# Patient Record
Sex: Female | Born: 1965
Health system: Southern US, Community
[De-identification: ages and names within clinical notes are randomized; demographics above are authoritative.]

## PROBLEM LIST (undated history)

## (undated) DIAGNOSIS — D219 Benign neoplasm of connective and other soft tissue, unspecified: Secondary | ICD-10-CM

## (undated) DIAGNOSIS — B019 Varicella without complication: Secondary | ICD-10-CM

## (undated) DIAGNOSIS — Z86718 Personal history of other venous thrombosis and embolism: Secondary | ICD-10-CM

## (undated) DIAGNOSIS — Z332 Encounter for elective termination of pregnancy: Secondary | ICD-10-CM

## (undated) DIAGNOSIS — Z803 Family history of malignant neoplasm of breast: Secondary | ICD-10-CM

## (undated) DIAGNOSIS — E785 Hyperlipidemia, unspecified: Secondary | ICD-10-CM

## (undated) DIAGNOSIS — H469 Unspecified optic neuritis: Secondary | ICD-10-CM

## (undated) HISTORY — PX: MYOMECTOMY: SHX85

## (undated) HISTORY — DX: Unspecified optic neuritis: H46.9

## (undated) HISTORY — DX: Family history of malignant neoplasm of breast: Z80.3

## (undated) HISTORY — DX: Varicella without complication: B01.9

## (undated) HISTORY — DX: Encounter for elective termination of pregnancy: Z33.2

## (undated) HISTORY — DX: Benign neoplasm of connective and other soft tissue, unspecified: D21.9

## (undated) HISTORY — DX: Hyperlipidemia, unspecified: E78.5

---

## 1998-04-26 HISTORY — PX: BUNIONECTOMY: SHX129

## 2005-02-10 ENCOUNTER — Ambulatory Visit: Payer: Self-pay | Admitting: Obstetrics and Gynecology

## 2006-03-31 ENCOUNTER — Ambulatory Visit: Payer: Self-pay | Admitting: Obstetrics and Gynecology

## 2007-06-27 ENCOUNTER — Ambulatory Visit: Payer: Self-pay | Admitting: Obstetrics and Gynecology

## 2008-06-27 ENCOUNTER — Ambulatory Visit: Payer: Self-pay | Admitting: Obstetrics and Gynecology

## 2009-07-02 ENCOUNTER — Ambulatory Visit: Payer: Self-pay | Admitting: Obstetrics and Gynecology

## 2010-06-09 LAB — HM PAP SMEAR

## 2010-07-07 ENCOUNTER — Ambulatory Visit: Payer: Self-pay | Admitting: Obstetrics and Gynecology

## 2010-07-07 LAB — HM MAMMOGRAPHY

## 2011-04-27 DIAGNOSIS — Z86718 Personal history of other venous thrombosis and embolism: Secondary | ICD-10-CM

## 2011-04-27 HISTORY — DX: Personal history of other venous thrombosis and embolism: Z86.718

## 2011-06-25 ENCOUNTER — Ambulatory Visit (INDEPENDENT_AMBULATORY_CARE_PROVIDER_SITE_OTHER): Payer: PRIVATE HEALTH INSURANCE | Admitting: Internal Medicine

## 2011-06-25 ENCOUNTER — Encounter: Payer: Self-pay | Admitting: Internal Medicine

## 2011-06-25 VITALS — BP 102/62 | HR 84 | Temp 98.3°F | Ht 63.0 in | Wt 125.0 lb

## 2011-06-25 DIAGNOSIS — M5430 Sciatica, unspecified side: Secondary | ICD-10-CM

## 2011-06-25 NOTE — Assessment & Plan Note (Signed)
Patient is not currently having any pain. Encouraged her to call or e-mail our office if she has a recurrent episode. Her description is most consistent with sciatic pain. We discussed potentially using a steroid taper to help with inflammation and pain should this recur. She will followup as needed.

## 2011-06-25 NOTE — Progress Notes (Signed)
Subjective:    Patient ID: Erin Robertson, female    DOB: 09/27/1965, 46 y.o.   MRN: [id number]  HPI 46 year old female presents to establish care. She reports that she is generally healthy. Her only concern today is a several year history of intermittent episodes of bilateral lower back and posterior thigh pain. She reports these episodes come and go. She was told by a previous physician to use Motrin on a regular basis during episodes of pain. She is unclear what triggers these episodes. She denies any trauma to her back or legs. She denies any weakness in her legs. She denies any numbness in her legs. She is not currently experiencing pain. She does not have any incontinence of bowel or bladder. Aside from this, she reports that she feels well. She follows a healthy diet and gets regular physical activity.  Outpatient Encounter Prescriptions as of 06/25/2011  Medication Sig Dispense Refill  . levonorgestrel-ethinyl estradiol (ENPRESSE,TRIVORA) tablet Take 1 tablet by mouth daily.        Review of Systems  Constitutional: Negative for fever, chills, appetite change, fatigue and unexpected weight change.  HENT: Negative for ear pain, congestion, sore throat, trouble swallowing, neck pain, voice change and sinus pressure.   Eyes: Negative for visual disturbance.  Respiratory: Negative for cough, shortness of breath, wheezing and stridor.   Cardiovascular: Negative for chest pain, palpitations and leg swelling.  Gastrointestinal: Negative for nausea, vomiting, abdominal pain, diarrhea, constipation, blood in stool, abdominal distention and anal bleeding.  Genitourinary: Negative for dysuria and flank pain.  Musculoskeletal: Negative for myalgias, arthralgias and gait problem.  Skin: Negative for color change and rash.  Neurological: Negative for dizziness and headaches.  Hematological: Negative for adenopathy. Does not bruise/bleed easily.  Psychiatric/Behavioral: Negative for suicidal ideas,  sleep disturbance and dysphoric mood. The patient is not nervous/anxious.    BP 102/62  Pulse 84  Temp(Src) 98.3 F (36.8 C) (Oral)  Ht 5\' 3"  (1.6 m)  Wt 125 lb (56.7 kg)  BMI 22.14 kg/m2  SpO2 100%  LMP 06/14/2011     Objective:   Physical Exam  Constitutional: She is oriented to person, place, and time. She appears well-developed and well-nourished. No distress.  HENT:  Head: Normocephalic and atraumatic.  Right Ear: External ear normal.  Left Ear: External ear normal.  Nose: Nose normal.  Mouth/Throat: Oropharynx is clear and moist. No oropharyngeal exudate.  Eyes: Conjunctivae are normal. Pupils are equal, round, and reactive to light. Right eye exhibits no discharge. Left eye exhibits no discharge. No scleral icterus.  Neck: Normal range of motion. Neck supple. No tracheal deviation present. No thyromegaly present.  Cardiovascular: Normal rate, regular rhythm, normal heart sounds and intact distal pulses.  Exam reveals no gallop and no friction rub.   No murmur heard. Pulmonary/Chest: Effort normal and breath sounds normal. No respiratory distress. She has no wheezes. She has no rales. She exhibits no tenderness.  Abdominal: Soft. Bowel sounds are normal. She exhibits no distension and no mass. There is no tenderness. There is no rebound and no guarding.  Musculoskeletal: Normal range of motion. She exhibits no edema and no tenderness.  Lymphadenopathy:    She has no cervical adenopathy.  Neurological: She is alert and oriented to person, place, and time. No cranial nerve deficit. She exhibits normal muscle tone. Coordination normal.  Skin: Skin is warm and dry. No rash noted. She is not diaphoretic. No erythema. No pallor.  Psychiatric: She has a normal mood and affect. Her  behavior is normal. Judgment and thought content normal.          Assessment & Plan:

## 2011-06-25 NOTE — Patient Instructions (Signed)
Vit D 1000-2000 units daily

## 2011-08-05 ENCOUNTER — Ambulatory Visit: Payer: Self-pay | Admitting: Family Medicine

## 2011-11-25 DIAGNOSIS — H469 Unspecified optic neuritis: Secondary | ICD-10-CM

## 2011-11-25 HISTORY — DX: Unspecified optic neuritis: H46.9

## 2011-12-03 ENCOUNTER — Ambulatory Visit: Payer: Self-pay | Admitting: Ophthalmology

## 2011-12-18 ENCOUNTER — Ambulatory Visit: Payer: Self-pay | Admitting: Neurology

## 2012-01-04 ENCOUNTER — Ambulatory Visit: Payer: Self-pay | Admitting: Neurology

## 2012-01-04 LAB — CSF CELL COUNT WITH DIFFERENTIAL
Neutrophils: 25 %
Other Cells: 0 %
RBC (CSF): 5 /mm3
WBC (CSF): 2 /mm3

## 2012-01-04 LAB — APTT: Activated PTT: 26.2 secs (ref 23.6–35.9)

## 2012-01-04 LAB — PROTIME-INR
INR: 0.9
Prothrombin Time: 12.1 secs (ref 11.5–14.7)

## 2012-03-24 ENCOUNTER — Encounter: Payer: Self-pay | Admitting: Internal Medicine

## 2012-03-24 ENCOUNTER — Ambulatory Visit (INDEPENDENT_AMBULATORY_CARE_PROVIDER_SITE_OTHER): Payer: PRIVATE HEALTH INSURANCE | Admitting: Internal Medicine

## 2012-03-24 VITALS — BP 118/68 | HR 78 | Temp 98.3°F | Resp 16 | Wt 126.0 lb

## 2012-03-24 DIAGNOSIS — H469 Unspecified optic neuritis: Secondary | ICD-10-CM | POA: Insufficient documentation

## 2012-03-24 DIAGNOSIS — R1031 Right lower quadrant pain: Secondary | ICD-10-CM | POA: Insufficient documentation

## 2012-03-24 DIAGNOSIS — R002 Palpitations: Secondary | ICD-10-CM | POA: Insufficient documentation

## 2012-03-24 NOTE — Assessment & Plan Note (Signed)
Chronic over several months. Location at [place] raises question of chronic appendicitis, however no other symptoms such as fever, chills, diarrhea, constipation.  Alternative etiologies include nephrolithiasis, or ovarian pathology, however pt reports pelvic US was normal. Will get CT abd and pelvis for further evaluation.

## 2012-03-24 NOTE — Assessment & Plan Note (Signed)
Recent episodes of palpitations, occuring throughout the day and night. No other symptoms such as diaphoresis, nausea, chest pain. EKG normal. Given constellation of other symptoms, question if Holter monitor and ECHO might be helpful in evaluation.

## 2012-03-24 NOTE — Assessment & Plan Note (Addendum)
Unclear etiology. Evaluation to date has shown no evidence of MS, NMO or other underlying cause of neuritis. Only lab abnormality has been elevated protein levels in CSF without oligoclonal bands.  We reviewed data on risk of MS after optic neuritis (30% at 5 years). Pt has been evaluated both locally and at [facility]. We discussed referral to tertiary care center such as [facility] for another opinion. Pt will discuss this with her family.

## 2012-03-24 NOTE — Progress Notes (Signed)
Subjective:    Patient ID: Erin Robertson, female    DOB: 1966/02/05, 46 y.o.   MRN: [id number]  HPI 46YO female presents for follow up. She reports that in August 2013, she developed blurred vision in her right eye. She ultimately went to [facility] for evaluation and was found to have optic neuritis. She was referred to Neurology and had evaluation including lab work, MRI of brain and cervical spine which she reports were normal. She also had lumbar puncture which showed elevated protein but no oligoclonal bands. She then went for second opinion at [facility]. Additional evaluation including lab testing for NMO was normal.  She was told that she may eventually develop MS. No specific interventions were recommended. She is interested to know if any further testing would be helpful.  She is also concerned today about several weeks of intermittent palpitations. These are described as rapid forceful beats, which occur both during the day and night, typically at rest. She denies chest pain, dyspnea, diaphoresis. The episodes last a few seconds then resolve without intervention.  She is also concerned about several months history of right lower abdominal pain.  Pain typically occurs at night. Described as sharp pain located between right hip and umbilicus that wakes pt from sleep. No fever, chills, dysuria, hematuria, flank pain, change in bowel habits, blood in stool. Was seen by OB in the past with these symptoms and reports that pelvic US was normal.  Outpatient Encounter Prescriptions as of 03/24/2012  Medication Sig Dispense Refill  . levonorgestrel-ethinyl estradiol (ENPRESSE,TRIVORA) tablet Take 1 tablet by mouth daily.       BP 118/68  Pulse 78  Temp 98.3 F (36.8 C) (Oral)  Resp 16  Wt 126 lb (57.153 kg)  LMP 03/17/2012  Review of Systems  Constitutional: Negative for fever, chills, appetite change, fatigue and unexpected weight change.  HENT: Negative for ear pain,  congestion, sore throat, trouble swallowing, neck pain, voice change and sinus pressure.   Eyes: Positive for visual disturbance.  Respiratory: Negative for cough, shortness of breath, wheezing and stridor.   Cardiovascular: Positive for palpitations. Negative for chest pain and leg swelling.  Gastrointestinal: Negative for nausea, vomiting, abdominal pain, diarrhea, constipation, blood in stool, abdominal distention and anal bleeding.  Genitourinary: Negative for dysuria and flank pain.  Musculoskeletal: Negative for myalgias, arthralgias and gait problem.  Skin: Negative for color change and rash.  Neurological: Negative for dizziness and headaches.  Hematological: Negative for adenopathy. Does not bruise/bleed easily.  Psychiatric/Behavioral: Negative for suicidal ideas, sleep disturbance and dysphoric mood. The patient is not nervous/anxious.        Objective:   Physical Exam  Constitutional: She is oriented to person, place, and time. She appears well-developed and well-nourished. No distress.  HENT:  Head: Normocephalic and atraumatic.  Right Ear: External ear normal.  Left Ear: External ear normal.  Nose: Nose normal.  Mouth/Throat: Oropharynx is clear and moist. No oropharyngeal exudate.  Eyes: Conjunctivae normal are normal. Pupils are equal, round, and reactive to light. Right eye exhibits no discharge. Left eye exhibits no discharge. No scleral icterus.  Neck: Normal range of motion. Neck supple. No tracheal deviation present. No thyromegaly present.  Cardiovascular: Normal rate, regular rhythm, normal heart sounds and intact distal pulses.  Exam reveals no gallop and no friction rub.   No murmur heard. Pulmonary/Chest: Effort normal and breath sounds normal. No respiratory distress. She has no wheezes. She has no rales. She exhibits no tenderness.  Abdominal: Soft. Bowel sounds are normal. She exhibits no distension. There is tenderness (RLQ). There is tenderness at [place]  [place]. There is no rebound and no guarding.  Musculoskeletal: Normal range of motion. She exhibits no edema and no tenderness.  Lymphadenopathy:    She has no cervical adenopathy.  Neurological: She is alert and oriented to person, place, and time. No cranial nerve deficit. She exhibits normal muscle tone. Coordination normal.  Skin: Skin is warm and dry. No rash noted. She is not diaphoretic. No erythema. No pallor.  Psychiatric: She has a normal mood and affect. Her behavior is normal. Judgment and thought content normal.          Assessment & Plan:

## 2012-03-30 ENCOUNTER — Ambulatory Visit (HOSPITAL_COMMUNITY)
Admission: RE | Admit: 2012-03-30 | Discharge: 2012-03-30 | Disposition: A | Discharge status: Home / Self Care | Payer: PRIVATE HEALTH INSURANCE | Source: Ambulatory Visit | Attending: Internal Medicine | Admitting: Internal Medicine

## 2012-03-30 DIAGNOSIS — D259 Leiomyoma of uterus, unspecified: Secondary | ICD-10-CM | POA: Insufficient documentation

## 2012-03-30 DIAGNOSIS — R1031 Right lower quadrant pain: Secondary | ICD-10-CM

## 2012-03-30 MED ORDER — IOHEXOL 300 MG/ML  SOLN
80.0000 mL | Freq: Once | INTRAMUSCULAR | Status: AC | PRN
  Administered 2012-03-30: 80 mL via INTRAVENOUS

## 2012-03-31 ENCOUNTER — Encounter: Payer: Self-pay | Admitting: Cardiovascular Disease

## 2012-03-31 ENCOUNTER — Ambulatory Visit (INDEPENDENT_AMBULATORY_CARE_PROVIDER_SITE_OTHER): Payer: PRIVATE HEALTH INSURANCE | Admitting: Cardiovascular Disease

## 2012-03-31 ENCOUNTER — Telehealth: Payer: Self-pay | Admitting: Internal Medicine

## 2012-03-31 VITALS — BP 120/72 | HR 89 | Ht 62.0 in | Wt 126.5 lb

## 2012-03-31 DIAGNOSIS — R935 Abnormal findings on diagnostic imaging of other abdominal regions, including retroperitoneum: Secondary | ICD-10-CM

## 2012-03-31 DIAGNOSIS — E785 Hyperlipidemia, unspecified: Secondary | ICD-10-CM

## 2012-03-31 DIAGNOSIS — E7800 Pure hypercholesterolemia, unspecified: Secondary | ICD-10-CM | POA: Insufficient documentation

## 2012-03-31 DIAGNOSIS — R002 Palpitations: Secondary | ICD-10-CM

## 2012-03-31 NOTE — Telephone Encounter (Signed)
Please call patient on her cell with this appointment.[phone].

## 2012-03-31 NOTE — Telephone Encounter (Signed)
The patient is scheduled for in hospital and they should have been scheduled for outpatient on there order for an ABD ultrasound. Please change order.

## 2012-03-31 NOTE — Assessment & Plan Note (Signed)
Cholesterol of 200. Mother and grandmother also with high cholesterol. No significant coronary artery disease or stroke history. She could try red yeast rice if she would like for more aggressive numbers.

## 2012-03-31 NOTE — Patient Instructions (Addendum)
Please take bystolic 1/2 pill as needed for fast, strong or irregular heart heat At night or in the Am Ok to take a second 1/2 pill for symptoms  Consider Red Yeast Rice 2 to 4 pills a day  Please call us if you have new issues that need to be addressed before your next appt.

## 2012-03-31 NOTE — Progress Notes (Signed)
   Patient ID: Erin Robertson, female    DOB: Mar 31, 1966, 46 y.o.   MRN: [id number]  HPI Comments: Varicose 46 year old woman, patient of Dr. Dan Humphreys, no prior cardiac history who presents for evaluation for symptoms of strong heart beat at nighttime prominently, sometimes with pulsation radiating into her head from her chest.   She reports that she has had significant stress recently. Diagnosis of optic neuritis in August 2013. Significant workup since that time including subspecialist evaluation at [facility]. She has neurology at [facility] and ophthalmology in [place]. Also right lower quadrant pain, CT scan done showing nonspecific hepatic findings. Hepatic ultrasound ordered. Interestingly, CT scan did not show pericardial effusion or pleural effusion.  She reports that she feels well, takes care of 4 children some of which are grown. She has 45 year old granddaughter. She works a full-time job in Audiological [employer]. She's had a difficult time with her right eye deficits. She describes this as a blurry vision on the right, chronically with minimal improvement since the initial diagnosis. She is learning to adapt and overcompensate with her left eye. Still able to drive. She denies any significant large to medium.   EKG shows normal sinus rhythm with rate 89 beats per minute, no significant ST or T wave changes.    Outpatient Encounter Prescriptions as of 03/31/2012  Medication Sig Dispense Refill  . levonorgestrel-ethinyl estradiol (ENPRESSE,TRIVORA) tablet Take 1 tablet by mouth daily.        Review of Systems  Constitutional: Negative.   HENT: Negative.   Eyes: Negative.   Respiratory: Negative.   Cardiovascular: Positive for palpitations.  Gastrointestinal: Negative.   Musculoskeletal: Negative.   Skin: Negative.   Neurological: Negative.   Hematological: Negative.   Psychiatric/Behavioral: Negative.   All other systems reviewed and are negative.    BP 120/72  Pulse 89  Ht 5\' 2"  (1.575 m)  Wt  126 lb 8 oz (57.38 kg)  BMI 23.14 kg/m2  LMP 03/17/2012  Physical Exam  Nursing note and vitals reviewed. Constitutional: She is oriented to person, place, and time. She appears well-developed and well-nourished.  HENT:  Head: Normocephalic.  Nose: Nose normal.  Mouth/Throat: Oropharynx is clear and moist.  Eyes: Conjunctivae normal are normal. Pupils are equal, round, and reactive to light.  Neck: Normal range of motion. Neck supple. No JVD present.  Cardiovascular: Normal rate, regular rhythm, S1 normal, S2 normal, normal heart sounds and intact distal pulses.  Exam reveals no gallop and no friction rub.   No murmur heard. Pulmonary/Chest: Effort normal and breath sounds normal. No respiratory distress. She has no wheezes. She has no rales. She exhibits no tenderness.  Abdominal: Soft. Bowel sounds are normal. She exhibits no distension. There is no tenderness.  Musculoskeletal: Normal range of motion. She exhibits no edema and no tenderness.  Lymphadenopathy:    She has no cervical adenopathy.  Neurological: She is alert and oriented to person, place, and time. Coordination normal.  Skin: Skin is warm and dry. No rash noted. No erythema.  Psychiatric: She has a normal mood and affect. Her behavior is normal. Judgment and thought content normal.         Assessment and Plan

## 2012-03-31 NOTE — Assessment & Plan Note (Signed)
She reports strong heart beat, predominantly at nighttime. By her report, rhythm is regular with no significant ectopy. We have discussed the various treatment options with her including echocardiogram, Holter. Unless concerned about pericardial effusion given her recent CT scan showing no effusion. EKG is normal, otherwise she is feeling well with activity and exertion. We'll hold off on echocardiogram for now. We have shown her several programs for her smart phone and how to check her pulse at home. I suspect her stress over the past several months is playing a role in her strong heart beat at nighttime. Heart rate is mildly elevated on today's visit though adequate. We have suggested she could try low-dose beta blocker as needed for symptom relief. We have given her samples of bystolic suggested she try 5 mg when necessary for strong heart beat. She could take extra 5 mg as needed. His symptoms get worse, she will contact our office. Certainly, Holter could be performed but by her history, it sounds regular with minimal ectopy. Treatment for any APCs or PVCs would be beta blocker initially. She is happy with this plan and will contact our office if symptoms get any worse.

## 2012-03-31 NOTE — Telephone Encounter (Signed)
Order entered.  Please schedule

## 2012-04-04 ENCOUNTER — Ambulatory Visit: Payer: Self-pay | Admitting: Internal Medicine

## 2012-04-04 ENCOUNTER — Telehealth: Payer: Self-pay | Admitting: Internal Medicine

## 2012-04-04 NOTE — Telephone Encounter (Signed)
UR liver was normal

## 2012-04-18 ENCOUNTER — Encounter: Payer: Self-pay | Admitting: Internal Medicine

## 2012-06-10 ENCOUNTER — Other Ambulatory Visit: Payer: Self-pay

## 2012-08-07 ENCOUNTER — Encounter: Payer: Self-pay | Admitting: Internal Medicine

## 2012-08-07 ENCOUNTER — Ambulatory Visit (INDEPENDENT_AMBULATORY_CARE_PROVIDER_SITE_OTHER): Payer: BC Managed Care – PPO | Admitting: Internal Medicine

## 2012-08-07 VITALS — BP 108/72 | HR 81 | Temp 99.0°F | Wt 124.0 lb

## 2012-08-07 DIAGNOSIS — N912 Amenorrhea, unspecified: Secondary | ICD-10-CM

## 2012-08-07 DIAGNOSIS — R1031 Right lower quadrant pain: Secondary | ICD-10-CM

## 2012-08-07 DIAGNOSIS — Z1239 Encounter for other screening for malignant neoplasm of breast: Secondary | ICD-10-CM

## 2012-08-07 DIAGNOSIS — G8929 Other chronic pain: Secondary | ICD-10-CM

## 2012-08-07 DIAGNOSIS — H469 Unspecified optic neuritis: Secondary | ICD-10-CM

## 2012-08-07 LAB — COMPREHENSIVE METABOLIC PANEL
CO2: 25 mEq/L (ref 19–32)
GFR: 96.4 mL/min (ref >60.00)
Glucose, Bld: 110 mg/dL — ABNORMAL HIGH (ref 70–99)
Sodium: 137 mEq/L (ref 135–145)
Total Bilirubin: 0.5 mg/dL (ref 0.3–1.2)
Total Protein: 6.8 g/dL (ref 6.0–8.3)

## 2012-08-07 NOTE — Assessment & Plan Note (Signed)
Chronic intermittent RLQ abdominal pain. Reviewed previous CT abdomen which showed no abnormalities in RLQ. Given that symptoms are persistent and pt has amenorrhea, will get pelvic US for further evaluation. Question if ovarian torsion could be contributing. Follow up 4 weeks and prn.

## 2012-08-07 NOTE — Assessment & Plan Note (Signed)
Two months amenorrhea after stopping OCP. Will check LH and FSH today to see if consistent with menopause. Will also check serum HCG. Will continue barrier protect with intercourse. Will get pelvic US for further evaluation amenorrhea and RLQ pain.

## 2012-08-07 NOTE — Progress Notes (Signed)
Subjective:    Patient ID: Erin Robertson, female    DOB: 1965/05/14, 47 y.o.   MRN: [id number]  HPI 47 year old female with history of optic neuritis and chronic right-sided abdominal pain presents for followup. In regards to optic neuritis, she notes she was recently seen by ophthalmologist at [facility]. He questions the diagnosis of optic neuritis and suggested she may actually have optic neuropathy. Regardless, plan is to continue to monitor for now.  She is concerned today about 2 month history of amenorrhea. She stopped taking oral contraceptives in February 2014. Since that time, she has not had any further menstrual cycle. She reports that she stopped taking oral contraceptives because of concern about risk of blood clots given the potential history of optic neuropathy from arteritis. She is interested in other methods of contraception.  She also reports continued intermittent right lower abdominal pain. This is described as aching which has been present off and on for years. She denies any change in bowel habits, blood in her stool, dysuria, hematuria, fever, chills. This is been evaluated extensively in the past with pelvic ultrasound, CT of the abdomen, all of which were normal.  Outpatient Encounter Prescriptions as of 08/07/2012  Medication Sig Dispense Refill  . aspirin 81 MG tablet Take 81 mg by mouth daily.      . Vitamin D, Ergocalciferol, (DRISDOL) 50000 UNITS CAPS Take 50,000 Units by mouth once a week.      . [DISCONTINUED] levonorgestrel-ethinyl estradiol (ENPRESSE,TRIVORA) tablet Take 1 tablet by mouth daily.       No facility-administered encounter medications on file as of 08/07/2012.   BP 108/72  Pulse 81  Temp(Src) 99 F (37.2 C) (Oral)  Wt 124 lb (56.246 kg)  BMI 22.67 kg/m2  SpO2 97%  LMP 06/13/2012  Review of Systems  Constitutional: Negative for fever, chills, appetite change, fatigue and unexpected weight change.  HENT: Negative for ear pain,  congestion, sore throat, trouble swallowing, neck pain, voice change and sinus pressure.   Eyes: Negative for visual disturbance.  Respiratory: Negative for cough, shortness of breath, wheezing and stridor.   Cardiovascular: Negative for chest pain, palpitations and leg swelling.  Gastrointestinal: Positive for abdominal pain. Negative for nausea, vomiting, diarrhea, constipation, blood in stool, abdominal distention and anal bleeding.  Genitourinary: Positive for menstrual problem and pelvic pain. Negative for dysuria and flank pain.  Musculoskeletal: Negative for myalgias, arthralgias and gait problem.  Skin: Negative for color change and rash.  Neurological: Negative for dizziness and headaches.  Hematological: Negative for adenopathy. Does not bruise/bleed easily.  Psychiatric/Behavioral: Negative for suicidal ideas, sleep disturbance and dysphoric mood. The patient is not nervous/anxious.        Objective:   Physical Exam  Constitutional: She is oriented to person, place, and time. She appears well-developed and well-nourished. No distress.  HENT:  Head: Normocephalic and atraumatic.  Right Ear: External ear normal.  Left Ear: External ear normal.  Nose: Nose normal.  Mouth/Throat: Oropharynx is clear and moist. No oropharyngeal exudate.  Eyes: Conjunctivae are normal. Pupils are equal, round, and reactive to light. Right eye exhibits no discharge. Left eye exhibits no discharge. No scleral icterus.  Neck: Normal range of motion. Neck supple. No tracheal deviation present. No thyromegaly present.  Cardiovascular: Normal rate, regular rhythm, normal heart sounds and intact distal pulses.  Exam reveals no gallop and no friction rub.   No murmur heard. Pulmonary/Chest: Effort normal and breath sounds normal. No respiratory distress. She has no wheezes. She  has no rales. She exhibits no tenderness.  Abdominal: Soft. Bowel sounds are normal. She exhibits no distension and no mass. There  is tenderness (RLQ). There is no rebound and no guarding.  Musculoskeletal: Normal range of motion. She exhibits no edema and no tenderness.  Lymphadenopathy:    She has no cervical adenopathy.  Neurological: She is alert and oriented to person, place, and time. No cranial nerve deficit. She exhibits normal muscle tone. Coordination normal.  Skin: Skin is warm and dry. No rash noted. She is not diaphoretic. No erythema. No pallor.  Psychiatric: She has a normal mood and affect. Her behavior is normal. Judgment and thought content normal.          Assessment & Plan:

## 2012-08-07 NOTE — Assessment & Plan Note (Signed)
Reviewed recent notes from ophthalmologist at [facility]. Will continue current plan of observation with neurology follow up. Unclear whether symptoms secondary to optic neuritis or optic neuropathy.

## 2012-08-09 ENCOUNTER — Ambulatory Visit: Payer: Self-pay | Admitting: Internal Medicine

## 2012-08-10 ENCOUNTER — Encounter: Payer: Self-pay | Admitting: Internal Medicine

## 2012-08-10 ENCOUNTER — Telehealth: Payer: Self-pay | Admitting: Internal Medicine

## 2012-08-10 NOTE — Telephone Encounter (Signed)
Ultrasound of the pelvis shows multiple fibroids in the uterus.  There is also a small cyst in the right ovary.

## 2012-08-22 ENCOUNTER — Ambulatory Visit: Payer: Self-pay | Admitting: Internal Medicine

## 2012-09-11 ENCOUNTER — Encounter: Payer: Self-pay | Admitting: Internal Medicine

## 2012-11-21 LAB — HM PAP SMEAR

## 2013-03-01 ENCOUNTER — Other Ambulatory Visit: Payer: Self-pay

## 2014-01-22 ENCOUNTER — Encounter: Payer: Self-pay | Admitting: Internal Medicine

## 2014-01-22 ENCOUNTER — Ambulatory Visit (INDEPENDENT_AMBULATORY_CARE_PROVIDER_SITE_OTHER): Payer: BC Managed Care – PPO | Admitting: Internal Medicine

## 2014-01-22 VITALS — BP 102/78 | HR 67 | Temp 98.3°F | Ht 62.75 in | Wt 123.5 lb

## 2014-01-22 DIAGNOSIS — Z0000 Encounter for general adult medical examination without abnormal findings: Secondary | ICD-10-CM

## 2014-01-22 DIAGNOSIS — Z1231 Encounter for screening mammogram for malignant neoplasm of breast: Secondary | ICD-10-CM | POA: Insufficient documentation

## 2014-01-22 NOTE — Progress Notes (Signed)
   Subjective:    Patient ID: Erin Robertson, female    DOB: February 23, 1966, 48 y.o.   MRN: [id number]  HPI 48YO female presents for annual exam.  Seen by Dr. Amalia Hailey last July. No changes made to medications. PAP was normal per her report.   Feeling well. Exercises by walking on occasion. Trying to follow a healthy diet.  Review of Systems  Constitutional: Negative for fever, chills, appetite change, fatigue and unexpected weight change.  Eyes: Negative for visual disturbance.  Respiratory: Negative for shortness of breath.   Cardiovascular: Negative for chest pain and leg swelling.  Gastrointestinal: Negative for vomiting, abdominal pain, diarrhea and constipation.  Genitourinary: Negative for vaginal bleeding and vaginal discharge.  Musculoskeletal: Negative for arthralgias and myalgias.  Skin: Negative for color change and rash.  Hematological: Negative for adenopathy. Does not bruise/bleed easily.  Psychiatric/Behavioral: Negative for dysphoric mood. The patient is not nervous/anxious.        Objective:    BP 102/78  Pulse 67  Temp(Src) 98.3 F (36.8 C) (Oral)  Ht 5' 2.75" (1.594 m)  Wt 123 lb 8 oz (56.019 kg)  BMI 22.05 kg/m2  SpO2 98%  LMP 12/29/2013 Physical Exam  Constitutional: She is oriented to person, place, and time. She appears well-developed and well-nourished. No distress.  HENT:  Head: Normocephalic and atraumatic.  Right Ear: External ear normal.  Left Ear: External ear normal.  Nose: Nose normal.  Mouth/Throat: Oropharynx is clear and moist. No oropharyngeal exudate.  Eyes: Conjunctivae and EOM are normal. Pupils are equal, round, and reactive to light. Right eye exhibits no discharge.  Neck: Normal range of motion. Neck supple. No thyromegaly present.  Cardiovascular: Normal rate, regular rhythm, normal heart sounds and intact distal pulses.  Exam reveals no gallop and no friction rub.   No murmur heard. Pulmonary/Chest: Effort normal. No respiratory  distress. She has no wheezes. She has no rales.  Abdominal: Soft. Bowel sounds are normal. She exhibits no distension and no mass. There is no tenderness. There is no rebound and no guarding.  Musculoskeletal: Normal range of motion. She exhibits no edema and no tenderness.  Lymphadenopathy:    She has no cervical adenopathy.  Neurological: She is alert and oriented to person, place, and time. No cranial nerve deficit. Coordination normal.  Skin: Skin is warm and dry. No rash noted. She is not diaphoretic. No erythema. No pallor.  Psychiatric: She has a normal mood and affect. Her behavior is normal. Judgment and thought content normal.          Assessment & Plan:   Problem List Items Addressed This Visit     Unprioritized   Routine general medical examination at a health care facility - Primary     General medical exam normal today. Breast and pelvic exam deferred as recently completed by her OB. Will request records on PAP smear. Mammogram ordered. Referral placed for colonoscopy. Reviewed recent labs with pt which showed normal kidney and liver function, normal blood counts. Flu vaccine declined.    Relevant Orders      MM Digital Screening      Ambulatory referral to Gastroenterology       Return in about 1 year (around 01/23/2015) for Physical.

## 2014-01-22 NOTE — Progress Notes (Signed)
Pre visit review using our clinic review tool, if applicable. No additional management support is needed unless otherwise documented below in the visit note. 

## 2014-01-22 NOTE — Assessment & Plan Note (Signed)
General medical exam normal today. Breast and pelvic exam deferred as recently completed by her OB. Will request records on PAP smear. Mammogram ordered. Referral placed for colonoscopy. Reviewed recent labs with pt which showed normal kidney and liver function, normal blood counts. Flu vaccine declined.

## 2014-01-22 NOTE — Patient Instructions (Signed)

## 2014-03-05 ENCOUNTER — Ambulatory Visit: Payer: Self-pay | Admitting: Internal Medicine

## 2014-03-05 ENCOUNTER — Encounter: Payer: Self-pay | Admitting: *Deleted

## 2014-03-05 LAB — HM MAMMOGRAPHY: HM MAMMO: NEGATIVE

## 2014-05-02 ENCOUNTER — Encounter: Payer: Self-pay | Admitting: Internal Medicine

## 2014-05-10 ENCOUNTER — Ambulatory Visit: Payer: Self-pay | Admitting: Unknown Physician Specialty

## 2014-05-10 LAB — HM COLONOSCOPY

## 2014-05-22 ENCOUNTER — Encounter: Payer: Self-pay | Admitting: *Deleted

## 2014-06-11 ENCOUNTER — Ambulatory Visit (INDEPENDENT_AMBULATORY_CARE_PROVIDER_SITE_OTHER): Payer: BLUE CROSS/BLUE SHIELD | Admitting: Internal Medicine

## 2014-06-11 ENCOUNTER — Encounter: Payer: Self-pay | Admitting: Internal Medicine

## 2014-06-11 VITALS — BP 92/64 | HR 66 | Temp 98.2°F | Ht 62.75 in | Wt 120.2 lb

## 2014-06-11 DIAGNOSIS — R197 Diarrhea, unspecified: Secondary | ICD-10-CM | POA: Insufficient documentation

## 2014-06-11 DIAGNOSIS — A09 Infectious gastroenteritis and colitis, unspecified: Secondary | ICD-10-CM

## 2014-06-11 MED ORDER — CIPROFLOXACIN HCL 500 MG PO TABS: 500.0000 mg | ORAL_TABLET | Freq: Two times a day (BID) | ORAL | Status: DC

## 2014-06-11 NOTE — Progress Notes (Signed)
Subjective:    Patient ID: Erin Robertson, female    DOB: [birth year], 49 y.o.   MRN: [id number]  HPI  49YO female presents for acute visit.  Diarrhea - Woke with abdominal cramping and diarrhea with blood and mucous on Sunday. Estimated as tbsp of blood. No NV. Rest of day, felt fine, but had a second episode of bloody diarrhea in the afternoon and third that night. Felt fine Monday, but developed bloody mucous in the stool in the evening. Appetite was normal yesterday. No fever. Feels fine today. Stayed out of work because of diarrhea. No diarrhea all day today. Tolerating regular food.  Traveled in December to [place]. Had mild diarrhea then, which resolved.  No known sick contacts. Went to [place] with [place] Saturday.  Colonoscopy in 04/2014 was normal.  Past medical, surgical, family and social history per today's encounter.  Review of Systems  Constitutional: Negative for fever, chills, appetite change, fatigue and unexpected weight change.  Eyes: Negative for visual disturbance.  Respiratory: Negative for shortness of breath.   Cardiovascular: Negative for chest pain and leg swelling.  Gastrointestinal: Positive for abdominal pain, diarrhea and blood in stool. Negative for nausea, vomiting, abdominal distention and rectal pain.  Skin: Negative for color change and rash.  Hematological: Negative for adenopathy. Does not bruise/bleed easily.  Psychiatric/Behavioral: Negative for dysphoric mood. The patient is not nervous/anxious.        Objective:    BP 92/64 mmHg  Pulse 66  Temp(Src) 98.2 F (36.8 C) (Oral)  Ht 5' 2.75" (1.594 m)  Wt 120 lb 4 oz (54.545 kg)  BMI 21.47 kg/m2  SpO2 100%  LMP 05/16/2014 Physical Exam  Constitutional: She is oriented to person, place, and time. She appears well-developed and well-nourished. No distress.  HENT:  Head: Normocephalic and atraumatic.  Right Ear: External ear normal.  Left Ear: External ear normal.  Nose: Nose  normal.  Mouth/Throat: Oropharynx is clear and moist.  Eyes: Conjunctivae are normal. Pupils are equal, round, and reactive to light. Right eye exhibits no discharge. Left eye exhibits no discharge. No scleral icterus.  Neck: Normal range of motion. Neck supple. No tracheal deviation present. No thyromegaly present.  Cardiovascular: Normal rate, regular rhythm, normal heart sounds and intact distal pulses.  Exam reveals no gallop and no friction rub.   No murmur heard. Pulmonary/Chest: Effort normal and breath sounds normal. No respiratory distress. She has no wheezes. She has no rales. She exhibits no tenderness.  Abdominal: Soft. Bowel sounds are normal. She exhibits no distension and no mass. There is no tenderness. There is no rebound and no guarding.  Musculoskeletal: Normal range of motion. She exhibits no edema or tenderness.  Lymphadenopathy:    She has no cervical adenopathy.  Neurological: She is alert and oriented to person, place, and time. No cranial nerve deficit. She exhibits normal muscle tone. Coordination normal.  Skin: Skin is warm and dry. No rash noted. She is not diaphoretic. No erythema. No pallor.  Psychiatric: She has a normal mood and affect. Her behavior is normal. Judgment and thought content normal.          Assessment & Plan:   Problem List Items Addressed This Visit      Unprioritized   Bloody diarrhea - Primary    3 days of bloody diarrhea. Suspect infectious source. We discussed that this will likely be self-limiting. However if she has persistent symptoms, will get stool culture, testing for E.coli and CDiff.  If symptoms persistent will start Cipro bid. Encouraged adequate fluids for hydration. Will check CBC and CMP with labs. Follow up 1 week and prn.      Relevant Medications   ciprofloxacin (CIPRO) tablet   Other Relevant Orders   Stool Culture   Stool C-Diff Toxin Assay   CBC w/Diff   Comprehensive metabolic panel       Return in about 1  week (around 06/18/2014) for Recheck.

## 2014-06-11 NOTE — Assessment & Plan Note (Signed)
3 days of bloody diarrhea. Suspect infectious source. We discussed that this will likely be self-limiting. However if she has persistent symptoms, will get stool culture, testing for E.coli and CDiff. If symptoms persistent will start Cipro bid. Encouraged adequate fluids for hydration. Will check CBC and CMP with labs. Follow up 1 week and prn.

## 2014-06-11 NOTE — Progress Notes (Signed)
Pre visit review using our clinic review tool, if applicable. No additional management support is needed unless otherwise documented below in the visit note. 

## 2014-06-11 NOTE — Patient Instructions (Signed)
Collect stool sample if any recurrent diarrhea.   If recurrent diarrhea, then start Cipro 500mg  twice daily.  Labs today.  Follow up next week.

## 2014-06-12 ENCOUNTER — Other Ambulatory Visit: Payer: Self-pay | Admitting: Internal Medicine

## 2014-06-12 LAB — CBC WITH DIFFERENTIAL/PLATELET
BASOS PCT: 0.5 % (ref 0.0–3.0)
Basophils Absolute: 0 10*3/uL (ref 0.0–0.1)
EOS PCT: 1.2 % (ref 0.0–5.0)
Eosinophils Absolute: 0.1 10*3/uL (ref 0.0–0.7)
HEMATOCRIT: 37.4 % (ref 36.0–46.0)
HEMOGLOBIN: 12.8 g/dL (ref 12.0–15.0)
LYMPHS ABS: 2.4 10*3/uL (ref 0.7–4.0)
Lymphocytes Relative: 29.3 % (ref 12.0–46.0)
MCHC: 34.1 g/dL (ref 30.0–36.0)
MCV: 85.7 fl (ref 78.0–100.0)
MONO ABS: 0.5 10*3/uL (ref 0.1–1.0)
MONOS PCT: 6.3 % (ref 3.0–12.0)
NEUTROS ABS: 5.2 10*3/uL (ref 1.4–7.7)
Neutrophils Relative %: 62.7 % (ref 43.0–77.0)
PLATELETS: 268 10*3/uL (ref 150.0–400.0)
RBC: 4.36 Mil/uL (ref 3.87–5.11)
RDW: 13.8 % (ref 11.5–15.5)
WBC: 8.2 10*3/uL (ref 4.0–10.5)

## 2014-06-12 LAB — COMPREHENSIVE METABOLIC PANEL
ALK PHOS: 58 U/L (ref 39–117)
ALT: 11 U/L (ref 0–35)
AST: 15 U/L (ref 0–37)
Albumin: 4.2 g/dL (ref 3.5–5.2)
BUN: 10 mg/dL (ref 6–23)
CALCIUM: 9.2 mg/dL (ref 8.4–10.5)
CHLORIDE: 105 meq/L (ref 96–112)
CO2: 24 mEq/L (ref 19–32)
CREATININE: 0.73 mg/dL (ref 0.40–1.20)
GFR: 89.65 mL/min (ref >60.00)
Glucose, Bld: 82 mg/dL (ref 70–99)
POTASSIUM: 4.1 meq/L (ref 3.5–5.1)
Sodium: 137 mEq/L (ref 135–145)
Total Bilirubin: 0.5 mg/dL (ref 0.2–1.2)
Total Protein: 6.6 g/dL (ref 6.0–8.3)

## 2014-06-13 LAB — C. DIFFICILE GDH AND TOXIN A/B
C. DIFF TOXIN A/B: NOT DETECTED
C. difficile GDH: NOT DETECTED

## 2014-06-16 LAB — STOOL CULTURE

## 2014-06-18 ENCOUNTER — Ambulatory Visit: Payer: BLUE CROSS/BLUE SHIELD | Admitting: Internal Medicine

## 2014-08-19 LAB — SURGICAL PATHOLOGY

## 2015-06-20 LAB — HM MAMMOGRAPHY: HM Mammogram: NEGATIVE

## 2015-07-03 ENCOUNTER — Encounter: Payer: Self-pay | Admitting: Internal Medicine

## 2015-07-10 ENCOUNTER — Encounter: Payer: Self-pay | Admitting: *Deleted

## 2015-07-10 ENCOUNTER — Other Ambulatory Visit: Payer: BLUE CROSS/BLUE SHIELD

## 2015-07-10 NOTE — Patient Instructions (Signed)
  Your procedure is scheduled on: 07-18-15 (FRIDAY) Report to [place] To find out your arrival time please call [phone] between 1PM - 3PM on 07-17-15 (THURSDAY)  Remember: Instructions that are not followed completely may result in serious medical risk, up to and including death, or upon the discretion of your surgeon and anesthesiologist your surgery may need to be rescheduled.    _X___ 1. Do not eat food or drink liquids after midnight. No gum chewing or hard candies.     _X___ 2. No Alcohol for 24 hours before or after surgery.   ____ 3. Bring all medications with you on the day of surgery if instructed.    _X___ 4. Notify your doctor if there is any change in your medical condition     (cold, fever, infections).     Do not wear jewelry, make-up, hairpins, clips or nail polish.  Do not wear lotions, powders, or perfumes. You may wear deodorant.  Do not shave 48 hours prior to surgery. Men may shave face and neck.  Do not bring valuables to the hospital.    [facility] is not responsible for any belongings or valuables.               Contacts, dentures or bridgework may not be worn into surgery.  Leave your suitcase in the car. After surgery it may be brought to your room.  For patients admitted to the hospital, discharge time is determined by your treatment team.   Patients discharged the day of surgery will not be allowed to drive home.   Please read over the following fact sheets that you were given:     ____ Take these medicines the morning of surgery with A SIP OF WATER:    1. NONE  2.   3.   4.  5.  6.  ____ Fleet Enema (as directed)   ____ Use CHG Soap as directed  ____ Use inhalers on the day of surgery  ____ Stop metformin 2 days prior to surgery    ____ Take 1/2 of usual insulin dose the night before surgery and none on the morning of surgery.   ____ Stop Coumadin/Plavix/aspirin-N/A  ____ Stop Anti-inflammatories-NO  NSAIDS OR ASPIRIN PRODUCTS-TYLENOL OK TO TAKE   ____ Stop supplements until after surgery.    ____ Bring C-Pap to the hospital.

## 2015-07-15 ENCOUNTER — Encounter
Admission: RE | Admit: 2015-07-15 | Discharge: 2015-07-15 | Disposition: A | Discharge status: Home / Self Care | Payer: BLUE CROSS/BLUE SHIELD | Source: Ambulatory Visit | Attending: Obstetrics & Gynecology | Admitting: Obstetrics & Gynecology

## 2015-07-15 DIAGNOSIS — N920 Excessive and frequent menstruation with regular cycle: Secondary | ICD-10-CM | POA: Diagnosis present

## 2015-07-15 DIAGNOSIS — Z9889 Other specified postprocedural states: Secondary | ICD-10-CM | POA: Diagnosis not present

## 2015-07-15 DIAGNOSIS — Z803 Family history of malignant neoplasm of breast: Secondary | ICD-10-CM | POA: Diagnosis not present

## 2015-07-15 DIAGNOSIS — N8500 Endometrial hyperplasia, unspecified: Secondary | ICD-10-CM | POA: Diagnosis not present

## 2015-07-15 DIAGNOSIS — Z86718 Personal history of other venous thrombosis and embolism: Secondary | ICD-10-CM | POA: Diagnosis not present

## 2015-07-15 DIAGNOSIS — D259 Leiomyoma of uterus, unspecified: Secondary | ICD-10-CM | POA: Diagnosis not present

## 2015-07-15 DIAGNOSIS — N921 Excessive and frequent menstruation with irregular cycle: Secondary | ICD-10-CM | POA: Diagnosis not present

## 2015-07-15 DIAGNOSIS — N840 Polyp of corpus uteri: Secondary | ICD-10-CM | POA: Diagnosis not present

## 2015-07-15 LAB — CBC
HCT: 38.3 % (ref 35.0–47.0)
HEMOGLOBIN: 13.3 g/dL (ref 12.0–16.0)
MCH: 30.4 pg (ref 26.0–34.0)
MCHC: 34.6 g/dL (ref 32.0–36.0)
MCV: 87.7 fL (ref 80.0–100.0)
PLATELETS: 247 10*3/uL (ref 150–440)
RBC: 4.36 MIL/uL (ref 3.80–5.20)
RDW: 14 % (ref 11.5–14.5)
WBC: 8.9 10*3/uL (ref 3.6–11.0)

## 2015-07-15 LAB — TYPE AND SCREEN
ABO/RH(D): O POS
Antibody Screen: NEGATIVE

## 2015-07-16 LAB — ABO/RH: ABO/RH(D): O POS

## 2015-07-18 ENCOUNTER — Ambulatory Visit: Payer: BLUE CROSS/BLUE SHIELD | Admitting: Anesthesiology

## 2015-07-18 ENCOUNTER — Encounter
Admission: RE | Disposition: A | Discharge status: Home / Self Care | Payer: Self-pay | Source: Ambulatory Visit | Attending: Obstetrics & Gynecology

## 2015-07-18 ENCOUNTER — Encounter: Payer: Self-pay | Admitting: *Deleted

## 2015-07-18 ENCOUNTER — Ambulatory Visit
Admission: RE | Admit: 2015-07-18 | Discharge: 2015-07-18 | Disposition: A | Discharge status: Home / Self Care | Payer: BLUE CROSS/BLUE SHIELD | Source: Ambulatory Visit | Attending: Obstetrics & Gynecology | Admitting: Obstetrics & Gynecology

## 2015-07-18 DIAGNOSIS — Z803 Family history of malignant neoplasm of breast: Secondary | ICD-10-CM | POA: Insufficient documentation

## 2015-07-18 DIAGNOSIS — N8500 Endometrial hyperplasia, unspecified: Secondary | ICD-10-CM | POA: Insufficient documentation

## 2015-07-18 DIAGNOSIS — Z86718 Personal history of other venous thrombosis and embolism: Secondary | ICD-10-CM | POA: Insufficient documentation

## 2015-07-18 DIAGNOSIS — D259 Leiomyoma of uterus, unspecified: Secondary | ICD-10-CM | POA: Insufficient documentation

## 2015-07-18 DIAGNOSIS — N921 Excessive and frequent menstruation with irregular cycle: Secondary | ICD-10-CM | POA: Insufficient documentation

## 2015-07-18 DIAGNOSIS — N840 Polyp of corpus uteri: Secondary | ICD-10-CM | POA: Diagnosis present

## 2015-07-18 DIAGNOSIS — N920 Excessive and frequent menstruation with regular cycle: Secondary | ICD-10-CM | POA: Diagnosis present

## 2015-07-18 DIAGNOSIS — Z9889 Other specified postprocedural states: Secondary | ICD-10-CM | POA: Insufficient documentation

## 2015-07-18 DIAGNOSIS — D250 Submucous leiomyoma of uterus: Secondary | ICD-10-CM | POA: Diagnosis present

## 2015-07-18 HISTORY — PX: DILATATION & CURETTAGE/HYSTEROSCOPY WITH MYOSURE: SHX6511

## 2015-07-18 HISTORY — DX: Personal history of other venous thrombosis and embolism: Z86.718

## 2015-07-18 HISTORY — PX: NOVASURE ABLATION: SHX5394

## 2015-07-18 LAB — POCT PREGNANCY, URINE: PREG TEST UR: NEGATIVE

## 2015-07-18 SURGERY — DILATATION & CURETTAGE/HYSTEROSCOPY WITH MYOSURE
Anesthesia: General | Wound class: Clean Contaminated

## 2015-07-18 MED ORDER — OXYCODONE-ACETAMINOPHEN 5-325 MG PO TABS: 1.0000 | ORAL_TABLET | ORAL | Status: DC | PRN

## 2015-07-18 MED ORDER — KETOROLAC TROMETHAMINE 30 MG/ML IJ SOLN
INTRAMUSCULAR | Status: DC | PRN
  Administered 2015-07-18: 30 mg via INTRAVENOUS

## 2015-07-18 MED ORDER — LACTATED RINGERS IV SOLN
INTRAVENOUS | Status: DC
  Administered 2015-07-18 (×2): via INTRAVENOUS

## 2015-07-18 MED ORDER — FENTANYL CITRATE (PF) 100 MCG/2ML IJ SOLN: 25.0000 ug | INTRAMUSCULAR | Status: DC | PRN

## 2015-07-18 MED ORDER — DEXAMETHASONE SODIUM PHOSPHATE 10 MG/ML IJ SOLN
INTRAMUSCULAR | Status: DC | PRN
  Administered 2015-07-18: 10 mg via INTRAVENOUS

## 2015-07-18 MED ORDER — LIDOCAINE HCL (CARDIAC) 20 MG/ML IV SOLN
INTRAVENOUS | Status: DC | PRN
  Administered 2015-07-18: 50 mg via INTRAVENOUS

## 2015-07-18 MED ORDER — SILVER NITRATE-POT NITRATE 75-25 % EX MISC
CUTANEOUS | Status: AC
  Filled 2015-07-18: qty 1

## 2015-07-18 MED ORDER — FAMOTIDINE 20 MG PO TABS
ORAL_TABLET | ORAL | Status: AC
Start: 2015-07-18 — End: 2015-07-18
  Administered 2015-07-18: 20 mg via ORAL
  Filled 2015-07-18: qty 1

## 2015-07-18 MED ORDER — MIDAZOLAM HCL 2 MG/2ML IJ SOLN
INTRAMUSCULAR | Status: DC | PRN
  Administered 2015-07-18: 2 mg via INTRAVENOUS

## 2015-07-18 MED ORDER — FENTANYL CITRATE (PF) 100 MCG/2ML IJ SOLN
INTRAMUSCULAR | Status: DC | PRN
  Administered 2015-07-18 (×2): 50 ug via INTRAVENOUS

## 2015-07-18 MED ORDER — PROPOFOL 10 MG/ML IV BOLUS
INTRAVENOUS | Status: DC | PRN
Start: 2015-07-18 — End: 2015-07-18
  Administered 2015-07-18: 100 mg via INTRAVENOUS

## 2015-07-18 MED ORDER — ONDANSETRON HCL 4 MG/2ML IJ SOLN
INTRAMUSCULAR | Status: DC | PRN
  Administered 2015-07-18: 4 mg via INTRAVENOUS

## 2015-07-18 MED ORDER — ONDANSETRON HCL 4 MG/2ML IJ SOLN: 4.0000 mg | Freq: Once | INTRAMUSCULAR | Status: DC | PRN

## 2015-07-18 MED ORDER — FAMOTIDINE 20 MG PO TABS
20.0000 mg | ORAL_TABLET | Freq: Once | ORAL | Status: AC
  Administered 2015-07-18: 20 mg via ORAL

## 2015-07-18 SURGICAL SUPPLY — 25 items
ABLATOR ENDOMETRIAL MYOSURE (ABLATOR) ×4 IMPLANT
BAG COUNTER SPONGE EZ (MISCELLANEOUS) ×2 IMPLANT
CANISTER SUC SOCK COL 7IN (MISCELLANEOUS) ×2 IMPLANT
CANISTER SUCT 3000ML (MISCELLANEOUS) ×2 IMPLANT
CATH ROBINSON RED A/P 16FR (CATHETERS) ×2 IMPLANT
ELECT REM PT RETURN 9FT ADLT (ELECTROSURGICAL) ×2
ELECTRODE REM PT RTRN 9FT ADLT (ELECTROSURGICAL) ×1 IMPLANT
GLOVE BIO SURGEON STRL SZ8 (GLOVE) ×2 IMPLANT
GOWN STRL REUS W/ TWL LRG LVL3 (GOWN DISPOSABLE) ×1 IMPLANT
GOWN STRL REUS W/ TWL XL LVL3 (GOWN DISPOSABLE) ×1 IMPLANT
GOWN STRL REUS W/TWL LRG LVL3 (GOWN DISPOSABLE) ×1
GOWN STRL REUS W/TWL XL LVL3 (GOWN DISPOSABLE) ×1
IV LACTATED RINGERS 1000ML (IV SOLUTION) ×2 IMPLANT
MYOSURE LITE POLYP REMOVAL (MISCELLANEOUS) ×2 IMPLANT
NOVASURE ENDOMETRIAL ABLATION (MISCELLANEOUS) ×4 IMPLANT
NS IRRIG 500ML POUR BTL (IV SOLUTION) ×2 IMPLANT
PACK DNC HYST (MISCELLANEOUS) ×2 IMPLANT
PAD OB MATERNITY 4.3X12.25 (PERSONAL CARE ITEMS) ×2 IMPLANT
PAD PREP 24X41 OB/GYN DISP (PERSONAL CARE ITEMS) ×2 IMPLANT
SOL .9 NS 3000ML IRR  AL (IV SOLUTION) ×1
SOL .9 NS 3000ML IRR UROMATIC (IV SOLUTION) ×1 IMPLANT
STRAP SAFETY BODY (MISCELLANEOUS) ×2 IMPLANT
TOWEL OR 17X26 4PK STRL BLUE (TOWEL DISPOSABLE) ×2 IMPLANT
TUBING CONNECTING 10 (TUBING) ×2 IMPLANT
TUBING HYSTEROSCOPY DOLPHIN (MISCELLANEOUS) ×2 IMPLANT

## 2015-07-18 NOTE — Anesthesia Preprocedure Evaluation (Signed)
Anesthesia Evaluation  Patient identified by MRN, date of birth, ID band Patient awake    Reviewed: Allergy & Precautions, NPO status , Patient's Chart, lab work & pertinent test results  History of Anesthesia Complications Negative for: history of anesthetic complications  Airway Mallampati: II       Dental  (+) Teeth Intact   Pulmonary neg pulmonary ROS,           Cardiovascular negative cardio ROS       Neuro/Psych negative neurological ROS     GI/Hepatic negative GI ROS, Neg liver ROS,   Endo/Other  negative endocrine ROS  Renal/GU negative Renal ROS     Musculoskeletal   Abdominal   Peds  Hematology negative hematology ROS (+)   Anesthesia Other Findings   Reproductive/Obstetrics                             Anesthesia Physical Anesthesia Plan  ASA: II  Anesthesia Plan: General   Post-op Pain Management:    Induction: Intravenous  Airway Management Planned: LMA  Additional Equipment:   Intra-op Plan:   Post-operative Plan:   Informed Consent: I have reviewed the patients History and Physical, chart, labs and discussed the procedure including the risks, benefits and alternatives for the proposed anesthesia with the patient or authorized representative who has indicated his/her understanding and acceptance.     Plan Discussed with:   Anesthesia Plan Comments:         Anesthesia Quick Evaluation

## 2015-07-18 NOTE — Addendum Note (Signed)
Addendum  created 07/18/15 1208 by Jenetta Downer, CRNA   Modules edited: Charges VN

## 2015-07-18 NOTE — H&P (Signed)
History and Physical Interval Note:  07/18/2015 7:04 AM  Erin Robertson  has presented today for surgery, with the diagnosis of FIBROID UTERUS,ENDOMETRIAL ABNORMALITY,MENOMETRORRHAGIA  The various methods of treatment have been discussed with the patient and family. After consideration of risks, benefits and other options for treatment, the patient has consented to  Procedure(s): [place] (N/A) [place] (N/A) as a surgical intervention .  The patient's history has been reviewed, patient examined, no change in status, stable for surgery.  Pt has the following beta blocker history-  Not taking Beta Blocker.  I have reviewed the patient's chart and labs.  Questions were answered to the patient's satisfaction.   Hoyt Koch

## 2015-07-18 NOTE — Op Note (Signed)
Operative Note  07/18/2015  PRE-OP DIAGNOSIS: Menorrhagia, Polyp, Fibroid   POST-OP DIAGNOSIS: same   SURGEON: Barnett Applebaum, MD, FACOG  PROCEDURE: Procedure(s): DILATATION & CURETTAGE HYSTEROSCOPY WITH MYOSURE NOVASURE ABLATION  MYOMECTOMY POLYPECTOMY  ANESTHESIA: General   ESTIMATED BLOOD LOSS: <20 mL   SPECIMENS:  ECC, EMC  FLUID DEFICIT: Min (LR)  COMPLICATIONS: None  DISPOSITION: PACU - hemodynamically stable.  CONDITION: stable  FINDINGS: Exam under anesthesia revealed small, mobile  uterus with no masses and bilateral adnexa without masses or fullness. Hysteroscopy revealed a fundal submucosal fibroid and a uterine corpus polyp, otherwise grossly normal appearing uterine cavity with bilateral tubal ostia and normal appearing endocervical canal.  PROCEDURE IN DETAIL: After informed consent was obtained, the patient was taken to the operating room where anesthesia was obtained without difficulty. The patient was positioned in the dorsal lithotomy position in [place]. The patient's bladder was catheterized with an in and out foley catheter. The patient was examined under anesthesia, with the above noted findings. The weightedspeculum was placed inside the patient's vagina, and the the anterior lip of the cervix was seen and grasped with the tenaculum.  An Endocervical specimen was obtained with a kevorkian curette. The uterine cavity was sounded to 8cm, and then the cervix was progressively dilated to a 8Hegar dilator. The 30 degree hysteroscope was introduced, with saline fluid used to distend the intrauterine cavity, with the above noted findings.  The myosure device was placed through the hysteroecope for extraction/ resection of SM Fibroid and Polyp, without complication.  Device removed and Novasure device placed.  Measurements taken for uterine length, cavity length and width.  Cavitation test performed.  Ablation performed for 92 sec without complication. Repeat  imaging with hysteroscope reveals appropriate post-ablation lining to uterus, no perforation or bleeding noted.  Excellent hemostasis was noted, and all instruments were removed, with excellent hemostasis noted throughout. She was then taken out of dorsal lithotomy. Minimal discrepancy in fluid was noted.  The patient tolerated the procedure well. Sponge, lap and needle counts were correct x2. The patient was taken to recovery room in excellent condition.

## 2015-07-18 NOTE — Transfer of Care (Signed)
Immediate Anesthesia Transfer of Care Note  Patient: Erin Robertson  Procedure(s) Performed: Procedure(s): DILATATION & CURETTAGE/HYSTEROSCOPY WITH MYOSURE (N/A) NOVASURE ABLATION (N/A)  Patient Location: PACU  Anesthesia Type:General  Level of Consciousness: awake, alert  and oriented  Airway & Oxygen Therapy: Patient Spontanous Breathing and Patient connected to nasal cannula oxygen  Post-op Assessment: Report given to RN and Post -op Vital signs reviewed and stable  Post vital signs: Reviewed and stable  Last Vitals:  Filed Vitals:   07/18/15 0610 07/18/15 0810  BP: 108/68 126/83  Pulse: 77 77  Temp: 37 C 36.2 C  Resp: 16 13    Complications: No apparent anesthesia complications

## 2015-07-18 NOTE — Discharge Instructions (Signed)
Hysteroscopy, Care After Refer to this sheet in the next few weeks. These instructions provide you with information on caring for yourself after your procedure. Your health care provider may also give you more specific instructions. Your treatment has been planned according to current medical practices, but problems sometimes occur. Call your health care provider if you have any problems or questions after your procedure.  WHAT TO EXPECT AFTER THE PROCEDURE After your procedure, it is typical to have the following:  You may have some cramping. This normally lasts for a couple days.  You may have bleeding. This can vary from light spotting for a few days to menstrual-like bleeding for 3-7 days. HOME CARE INSTRUCTIONS  Rest for the first 1-2 days after the procedure.  Only take over-the-counter or prescription medicines as directed by your health care provider. Do not take aspirin. It can increase the chances of bleeding.  Take showers instead of baths for 2 weeks or as directed by your health care provider.  Do not drive for 24 hours or as directed.  Do not drink alcohol while taking pain medicine.  Do not use tampons, douche, or have sexual intercourse for 2 weeks or until your health care provider says it is okay.  Take your temperature twice a day for 4-5 days. Write it down each time.  Follow your health care provider's advice about diet, exercise, and lifting.  If you develop constipation, you may:  Take a mild laxative if your health care provider approves.  Add bran foods to your diet.  Drink enough fluids to keep your urine clear or pale yellow.  Try to have someone with you or available to you for the first 24-48 hours, especially if you were given a general anesthetic.  Follow up with your health care provider as directed. SEEK MEDICAL CARE IF:  You feel dizzy or lightheaded.  You feel sick to your stomach (nauseous).  You have abnormal vaginal discharge.  You  have a rash.  You have pain that is not controlled with medicine. SEEK IMMEDIATE MEDICAL CARE IF:  You have bleeding that is heavier than a normal menstrual period.  You have a fever.  You have increasing cramps or pain, not controlled with medicine.  You have new belly (abdominal) pain.  You pass out.  You have pain in the tops of your shoulders (shoulder strap areas).  You have shortness of breath.   This information is not intended to replace advice given to you by your health care provider. Make sure you discuss any questions you have with your health care provider.   Document Released: 01/31/2013 Document Reviewed: 01/31/2013 Elsevier Interactive Patient Education 2016 [place]   1) The drugs that you were given will stay in your system until tomorrow so for the next 24 hours you should not:  A) Drive an automobile B) Make any legal decisions C) Drink any alcoholic beverage   2) You may resume regular meals tomorrow.  Today it is better to start with liquids and gradually work up to solid foods.  You may eat anything you prefer, but it is better to start with liquids, then soup and crackers, and gradually work up to solid foods.   3) Please notify your doctor immediately if you have any unusual bleeding, trouble breathing, redness and pain at the surgery site, drainage, fever, or pain not relieved by medication.    4) Additional Instructions:   Please contact your  physician with any problems or Same Day Surgery at [phone], Monday through Friday 6 am to 4 pm, or [place] [facility] number at [phone].

## 2015-07-18 NOTE — Anesthesia Postprocedure Evaluation (Signed)
Anesthesia Post Note  Patient: Erin Robertson  Procedure(s) Performed: Procedure(s) (LRB): DILATATION & CURETTAGE/HYSTEROSCOPY WITH MYOSURE (N/A) NOVASURE ABLATION (N/A)  Patient location during evaluation: PACU Anesthesia Type: General Level of consciousness: awake and alert Pain management: pain level controlled Vital Signs Assessment: post-procedure vital signs reviewed and stable Respiratory status: spontaneous breathing and respiratory function stable Cardiovascular status: stable Anesthetic complications: no    Last Vitals:  Filed Vitals:   07/18/15 0855 07/18/15 0905  BP: 120/76 118/72  Pulse: 65 63  Temp: 36.7 C 35.9 C  Resp: 16 14    Last Pain:  Filed Vitals:   07/18/15 0906  PainSc: 0-No pain                 KEPHART,WILLIAM K

## 2015-07-21 LAB — SURGICAL PATHOLOGY

## 2015-10-16 DIAGNOSIS — Z01419 Encounter for gynecological examination (general) (routine) without abnormal findings: Secondary | ICD-10-CM | POA: Diagnosis not present

## 2015-10-16 LAB — HM PAP SMEAR: HM PAP: NEGATIVE

## 2015-11-24 DIAGNOSIS — D229 Melanocytic nevi, unspecified: Secondary | ICD-10-CM | POA: Diagnosis not present

## 2015-11-24 DIAGNOSIS — D485 Neoplasm of uncertain behavior of skin: Secondary | ICD-10-CM | POA: Diagnosis not present

## 2015-11-24 DIAGNOSIS — L820 Inflamed seborrheic keratosis: Secondary | ICD-10-CM | POA: Diagnosis not present

## 2015-11-24 DIAGNOSIS — L578 Other skin changes due to chronic exposure to nonionizing radiation: Secondary | ICD-10-CM | POA: Diagnosis not present

## 2015-11-24 DIAGNOSIS — Z1283 Encounter for screening for malignant neoplasm of skin: Secondary | ICD-10-CM | POA: Diagnosis not present

## 2016-06-14 DIAGNOSIS — M7541 Impingement syndrome of right shoulder: Secondary | ICD-10-CM | POA: Diagnosis not present

## 2016-06-14 DIAGNOSIS — M7521 Bicipital tendinitis, right shoulder: Secondary | ICD-10-CM | POA: Diagnosis not present

## 2016-06-14 DIAGNOSIS — M25511 Pain in right shoulder: Secondary | ICD-10-CM | POA: Diagnosis not present

## 2016-06-16 DIAGNOSIS — M7521 Bicipital tendinitis, right shoulder: Secondary | ICD-10-CM | POA: Diagnosis not present

## 2016-06-16 DIAGNOSIS — M7541 Impingement syndrome of right shoulder: Secondary | ICD-10-CM | POA: Diagnosis not present

## 2017-05-02 ENCOUNTER — Encounter: Payer: Self-pay | Admitting: Obstetrics & Gynecology

## 2017-05-03 ENCOUNTER — Encounter: Payer: Self-pay | Admitting: Obstetrics & Gynecology

## 2017-05-03 ENCOUNTER — Ambulatory Visit (INDEPENDENT_AMBULATORY_CARE_PROVIDER_SITE_OTHER): Payer: BLUE CROSS/BLUE SHIELD | Admitting: Obstetrics & Gynecology

## 2017-05-03 VITALS — BP 110/70 | HR 69 | Ht 62.0 in | Wt 130.0 lb

## 2017-05-03 DIAGNOSIS — Z1211 Encounter for screening for malignant neoplasm of colon: Secondary | ICD-10-CM | POA: Diagnosis not present

## 2017-05-03 DIAGNOSIS — Z01419 Encounter for gynecological examination (general) (routine) without abnormal findings: Secondary | ICD-10-CM | POA: Diagnosis not present

## 2017-05-03 DIAGNOSIS — Z1239 Encounter for other screening for malignant neoplasm of breast: Secondary | ICD-10-CM

## 2017-05-03 DIAGNOSIS — Z803 Family history of malignant neoplasm of breast: Secondary | ICD-10-CM | POA: Diagnosis not present

## 2017-05-03 DIAGNOSIS — Z0000 Encounter for general adult medical examination without abnormal findings: Secondary | ICD-10-CM

## 2017-05-03 DIAGNOSIS — Z1231 Encounter for screening mammogram for malignant neoplasm of breast: Secondary | ICD-10-CM | POA: Diagnosis not present

## 2017-05-03 NOTE — Progress Notes (Signed)
HPI:      [name] is a 52 y.o. [phone] who LMP was in the past, she presents today for her annual examination.  The patient has no complaints today. The patient is sexually active. Herlast pap: approximate date 2017 and was normal and last mammogram: approximate date 2017 and was normal.  The patient does perform self breast exams.  There is notable family history of breast or ovarian cancer in her family. The patient is not taking hormone replacement therapy. Patient denies post-menopausal vaginal bleeding.NO BLEEDING; ABLATION 2 years ago.   The patient has regular exercise: yes. The patient denies current symptoms of depression.    GYN Hx: Last Colonoscopy:3 years ago. Normal.  Last DEXA: never ago.    PMHx: Past Medical History:  Diagnosis Date  . Abortion in first trimester   . Fibroids   . History of blood clots 2013   POSSIBLE BLOOD CLOT YEARS AGO IN RIGHT EYE-PT HAS NERVE DAMAGE  . Optic neuritis 11/2011   Past Surgical History:  Procedure Laterality Date  . BUNIONECTOMY  2000   Bilateral, Dr. Elvina Mattes  . DILATATION & CURETTAGE/HYSTEROSCOPY WITH MYOSURE N/A 07/18/2015   Procedure: DILATATION & CURETTAGE/HYSTEROSCOPY WITH MYOSURE;  Surgeon: Gae Dry, MD;  Location: [facility] ORS;  Service: Gynecology;  Laterality: N/A;  . MYOMECTOMY    . NOVASURE ABLATION N/A 07/18/2015   Procedure: NOVASURE ABLATION;  Surgeon: Gae Dry, MD;  Location: [facility] ORS;  Service: Gynecology;  Laterality: N/A;  . VAGINAL DELIVERY     X 4   Family History  Problem Relation Age of Onset  . Arthritis Mother   . COPD Mother   . Hyperlipidemia Mother   . Cancer Sister 32       Breast  . Stroke Maternal Grandmother   . Hyperlipidemia Maternal Grandmother   . Cancer Other        Great Aunt - Breast   Social History   Tobacco Use  . Smoking status: Never Smoker  . Smokeless tobacco: Never Used  Substance Use Topics  . Alcohol use: Yes    Comment: Occasional  . Drug use: No      Current Outpatient Medications:  .  Chlorpheniramine-Pseudoeph (PSEUDOEPHEDRINE COLD/ALLERGY PO), Take 1 tablet by mouth daily., Disp: , Rfl:  .  Cyanocobalamin (VITAMIN B 12 PO), Take 1 tablet by mouth daily., Disp: , Rfl:  .  Iron TABS, Take 1 tablet by mouth daily., Disp: , Rfl:  .  VITAMIN D, CHOLECALCIFEROL, PO, Take 1 tablet by mouth daily., Disp: , Rfl:  .  oxyCODONE-acetaminophen (PERCOCET) 5-325 MG tablet, Take 1 tablet by mouth every 4 (four) hours as needed for moderate pain or severe pain. (Patient not taking: Reported on 05/03/2017), Disp: 30 tablet, Rfl: 0 Allergies: Patient has no known allergies.  Review of Systems  Constitutional: Negative for chills, fever and malaise/fatigue.  HENT: Negative for congestion, sinus pain and sore throat.   Eyes: Negative for blurred vision and pain.  Respiratory: Negative for cough and wheezing.   Cardiovascular: Negative for chest pain and leg swelling.  Gastrointestinal: Negative for abdominal pain, constipation, diarrhea, heartburn, nausea and vomiting.  Genitourinary: Negative for dysuria, frequency, hematuria and urgency.  Musculoskeletal: Negative for back pain, joint pain, myalgias and neck pain.  Skin: Negative for itching and rash.  Neurological: Negative for dizziness, tremors and weakness.  Endo/Heme/Allergies: Does not bruise/bleed easily.  Psychiatric/Behavioral: Negative for depression. The patient is not nervous/anxious and does not have insomnia.  Objective: BP 110/70   Pulse 69   Ht 5' 2"  (1.575 m)   Wt 130 lb (59 kg)   LMP 06/24/2016   BMI 23.78 kg/m   Filed Weights   05/03/17 0806  Weight: 130 lb (59 kg)   Body mass index is 23.78 kg/m. Physical Exam  Constitutional: She is oriented to person, place, and time. She appears well-developed and well-nourished. No distress.  Genitourinary: Rectum normal, vagina normal and uterus normal. Pelvic exam was performed with patient supine. There is no rash or lesion  on the right labia. There is no rash or lesion on the left labia. Vagina exhibits no lesion. No bleeding in the vagina. Right adnexum does not display mass and does not display tenderness. Left adnexum does not display mass and does not display tenderness. Cervix does not exhibit motion tenderness, lesion, friability or polyp.   Uterus is mobile and midaxial. Uterus is not enlarged or exhibiting a mass.  Genitourinary Comments: Small cervical polyp at os, 2 mm  HENT:  Head: Normocephalic and atraumatic. Head is without laceration.  Right Ear: Hearing normal.  Left Ear: Hearing normal.  Nose: No epistaxis.  No foreign bodies.  Mouth/Throat: Uvula is midline, oropharynx is clear and moist and mucous membranes are normal.  Eyes: Pupils are equal, round, and reactive to light.  Neck: Normal range of motion. Neck supple. No thyromegaly present.  Cardiovascular: Normal rate and regular rhythm. Exam reveals no gallop and no friction rub.  No murmur heard. Pulmonary/Chest: Effort normal and breath sounds normal. No respiratory distress. She has no wheezes. Right breast exhibits no mass, no skin change and no tenderness. Left breast exhibits no mass, no skin change and no tenderness.  Abdominal: Soft. Bowel sounds are normal. She exhibits no distension. There is no tenderness. There is no rebound.  Musculoskeletal: Normal range of motion.  Neurological: She is alert and oriented to person, place, and time. No cranial nerve deficit.  Skin: Skin is warm and dry.  Psychiatric: She has a normal mood and affect. Judgment normal.  Vitals reviewed.   Assessment: Annual Exam 1. Annual physical exam   2. Screening for breast cancer   3. Family history of breast cancer   4. Screen for colon cancer     Plan:            1.  Cervical Screening-  Pap smear schedule reviewed with patient  2. Breast screening- Exam annually and mammogram scheduled  3. Colonoscopy every 10 years, Hemoccult testing after  age 48  4. Labs managed by PCP  5. Counseling for hormonal therapy: none, no change in therapy today  6.  She presents with a significant personal and/or family history of breast cancer sister dx age 35, also Maternal Gr Aunt <50 breast cancer. Details of which can be found in her medical/family history. She does not have a previously identified BRCA and Lynch syndrome mutation in her family. Due to her personal and/or family history of cancer she is a candidate for the [facility] test(s).    Risk for cancer, genetic susceptibility discussed.  Patient has requested gene testing.  Discussed BRCA as well as Lynch syndrome and other cancer risk assessments available based on her family history and personal history. Pros and cons of testing discussed.   7. Cervical polyp, small, monitor for bleeding    F/U  Return in about 1 year (around 05/03/2018) for Annual.  Barnett Applebaum, MD, [facility], [facility] 05/03/2017  8:34 AM

## 2017-05-03 NOTE — Patient Instructions (Signed)
PAP every three years Mammogram every year    Call [phone] to schedule at [place] Colonoscopy every 5 years Labs yearly (with PCP)   

## 2017-06-02 ENCOUNTER — Ambulatory Visit
Admission: RE | Admit: 2017-06-02 | Discharge: 2017-06-02 | Disposition: A | Discharge status: Home / Self Care | Payer: BLUE CROSS/BLUE SHIELD | Source: Ambulatory Visit | Attending: Obstetrics & Gynecology | Admitting: Obstetrics & Gynecology

## 2017-06-02 DIAGNOSIS — Z803 Family history of malignant neoplasm of breast: Secondary | ICD-10-CM

## 2017-06-02 DIAGNOSIS — Z1239 Encounter for other screening for malignant neoplasm of breast: Secondary | ICD-10-CM

## 2017-06-02 DIAGNOSIS — Z1231 Encounter for screening mammogram for malignant neoplasm of breast: Secondary | ICD-10-CM | POA: Insufficient documentation

## 2017-06-08 ENCOUNTER — Other Ambulatory Visit: Payer: Self-pay | Admitting: *Deleted

## 2017-06-08 ENCOUNTER — Inpatient Hospital Stay
Admission: RE | Admit: 2017-06-08 | Discharge: 2017-06-08 | Disposition: A | Discharge status: Home / Self Care | Payer: Self-pay | Source: Ambulatory Visit | Attending: *Deleted | Admitting: *Deleted

## 2017-06-08 DIAGNOSIS — Z9289 Personal history of other medical treatment: Secondary | ICD-10-CM

## 2017-06-15 DIAGNOSIS — L820 Inflamed seborrheic keratosis: Secondary | ICD-10-CM | POA: Diagnosis not present

## 2017-06-15 DIAGNOSIS — D229 Melanocytic nevi, unspecified: Secondary | ICD-10-CM | POA: Diagnosis not present

## 2017-06-15 DIAGNOSIS — D485 Neoplasm of uncertain behavior of skin: Secondary | ICD-10-CM | POA: Diagnosis not present

## 2017-06-15 DIAGNOSIS — Z1283 Encounter for screening for malignant neoplasm of skin: Secondary | ICD-10-CM | POA: Diagnosis not present

## 2017-06-15 DIAGNOSIS — L821 Other seborrheic keratosis: Secondary | ICD-10-CM | POA: Diagnosis not present

## 2017-09-06 ENCOUNTER — Telehealth: Payer: Self-pay

## 2017-09-06 NOTE — Telephone Encounter (Signed)
Pt calling requesting copy of her immunizations. Pt aware via vm that we do not have any immunizations on file and she should check with her PCP.

## 2017-12-15 DIAGNOSIS — H47011 Ischemic optic neuropathy, right eye: Secondary | ICD-10-CM | POA: Diagnosis not present

## 2018-06-12 ENCOUNTER — Other Ambulatory Visit: Payer: Self-pay

## 2018-07-06 ENCOUNTER — Ambulatory Visit (INDEPENDENT_AMBULATORY_CARE_PROVIDER_SITE_OTHER): Payer: BLUE CROSS/BLUE SHIELD | Admitting: Obstetrics & Gynecology

## 2018-07-06 ENCOUNTER — Other Ambulatory Visit (HOSPITAL_COMMUNITY)
Admission: RE | Admit: 2018-07-06 | Discharge: 2018-07-06 | Disposition: A | Discharge status: Home / Self Care | Payer: BLUE CROSS/BLUE SHIELD | Attending: Obstetrics & Gynecology | Admitting: Obstetrics & Gynecology

## 2018-07-06 ENCOUNTER — Other Ambulatory Visit: Payer: Self-pay

## 2018-07-06 ENCOUNTER — Encounter: Payer: Self-pay | Admitting: Obstetrics & Gynecology

## 2018-07-06 ENCOUNTER — Other Ambulatory Visit (HOSPITAL_COMMUNITY)
Admission: RE | Admit: 2018-07-06 | Payer: BLUE CROSS/BLUE SHIELD | Source: Ambulatory Visit | Admitting: Obstetrics & Gynecology

## 2018-07-06 VITALS — BP 120/80 | Ht 62.0 in | Wt 138.0 lb

## 2018-07-06 DIAGNOSIS — Z1211 Encounter for screening for malignant neoplasm of colon: Secondary | ICD-10-CM

## 2018-07-06 DIAGNOSIS — Z1239 Encounter for other screening for malignant neoplasm of breast: Secondary | ICD-10-CM

## 2018-07-06 DIAGNOSIS — Z01419 Encounter for gynecological examination (general) (routine) without abnormal findings: Secondary | ICD-10-CM

## 2018-07-06 DIAGNOSIS — Z124 Encounter for screening for malignant neoplasm of cervix: Secondary | ICD-10-CM

## 2018-07-06 DIAGNOSIS — Z803 Family history of malignant neoplasm of breast: Secondary | ICD-10-CM

## 2018-07-06 NOTE — Progress Notes (Signed)
HPI:      Erin Robertson is a 53 y.o. [phone] who LMP was in the past, she presents today for her annual examination.  The patient has no complaints today. The patient is sexually active. Herlast pap: approximate date 2017 and was normal and last mammogram: approximate date 2019 and was normal.  The patient does perform self breast exams.  There is notable family history of breast or ovarian cancer in her family.  She has a sister w breast cancer, who tested NEG for BRCA. The patient is not taking hormone replacement therapy. Patient denies post-menopausal vaginal bleeding.  No periods since ablation several years ago.  No hot flashes.  Rare LOU w cough. The patient has regular exercise: yes. The patient denies current symptoms of depression.    GYN Hx: Last Colonoscopy:4 years ago. Normal.  Last DEXA: never ago.    PMHx: Past Medical History:  Diagnosis Date  . Abortion in first trimester   . Fibroids   . History of blood clots 2013   POSSIBLE BLOOD CLOT YEARS AGO IN RIGHT EYE-PT HAS NERVE DAMAGE  . Optic neuritis 11/2011   Past Surgical History:  Procedure Laterality Date  . BUNIONECTOMY  2000   Bilateral, Dr. Elvina Mattes  . DILATATION & CURETTAGE/HYSTEROSCOPY WITH MYOSURE N/A 07/18/2015   Procedure: DILATATION & CURETTAGE/HYSTEROSCOPY WITH MYOSURE;  Surgeon: Gae Dry, MD;  Location: [facility] ORS;  Service: Gynecology;  Laterality: N/A;  . MYOMECTOMY    . NOVASURE ABLATION N/A 07/18/2015   Procedure: NOVASURE ABLATION;  Surgeon: Gae Dry, MD;  Location: [facility] ORS;  Service: Gynecology;  Laterality: N/A;  . VAGINAL DELIVERY     X 4   Family History  Problem Relation Age of Onset  . Arthritis Mother   . COPD Mother   . Hyperlipidemia Mother   . Cancer Sister 50       Breast  . Stroke Maternal Grandmother   . Hyperlipidemia Maternal Grandmother   . Cancer Other        Great Aunt - Breast  . Breast cancer Neg Hx    Social History   Tobacco Use  . Smoking status:  Never Smoker  . Smokeless tobacco: Never Used  Substance Use Topics  . Alcohol use: Yes    Comment: Occasional  . Drug use: No    Current Outpatient Medications:  .  Chlorpheniramine-Pseudoeph (PSEUDOEPHEDRINE COLD/ALLERGY PO), Take 1 tablet by mouth daily., Disp: , Rfl:  .  Cyanocobalamin (VITAMIN B 12 PO), Take 1 tablet by mouth daily., Disp: , Rfl:  .  Iron TABS, Take 1 tablet by mouth daily., Disp: , Rfl:  .  VITAMIN D, CHOLECALCIFEROL, PO, Take 1 tablet by mouth daily., Disp: , Rfl:  Allergies: Patient has no known allergies.  Review of Systems  Constitutional: Negative for chills, fever and malaise/fatigue.  HENT: Negative for congestion, sinus pain and sore throat.   Eyes: Negative for blurred vision and pain.  Respiratory: Negative for cough and wheezing.   Cardiovascular: Negative for chest pain and leg swelling.  Gastrointestinal: Negative for abdominal pain, constipation, diarrhea, heartburn, nausea and vomiting.  Genitourinary: Negative for dysuria, frequency, hematuria and urgency.  Musculoskeletal: Negative for back pain, joint pain, myalgias and neck pain.  Skin: Negative for itching and rash.  Neurological: Negative for dizziness, tremors and weakness.  Endo/Heme/Allergies: Does not bruise/bleed easily.  Psychiatric/Behavioral: Negative for depression. The patient is not nervous/anxious and does not have insomnia.    Objective: BP 120/80  Ht 5' 2"  (1.575 m)   Wt 138 lb (62.6 kg)   BMI 25.24 kg/m   Filed Weights   07/06/18 1432  Weight: 138 lb (62.6 kg)   Body mass index is 25.24 kg/m. Physical Exam Constitutional:      General: She is not in acute distress.    Appearance: She is well-developed.  Genitourinary:     Pelvic exam was performed with patient supine.     Vagina, uterus and rectum normal.     No lesions in the vagina.     No vaginal bleeding.     No cervical motion tenderness, friability, lesion or polyp.     Uterus is mobile.     Uterus is  not enlarged.     No uterine mass detected.    Uterus is midaxial.     No right or left adnexal mass present.     Right adnexa not tender.     Left adnexa not tender.  HENT:     Head: Normocephalic and atraumatic. No laceration.     Right Ear: Hearing normal.     Left Ear: Hearing normal.     Mouth/Throat:     Pharynx: Uvula midline.  Eyes:     Pupils: Pupils are equal, round, and reactive to light.  Neck:     Musculoskeletal: Normal range of motion and neck supple.     Thyroid: No thyromegaly.  Cardiovascular:     Rate and Rhythm: Normal rate and regular rhythm.     Heart sounds: No murmur. No friction rub. No gallop.   Pulmonary:     Effort: Pulmonary effort is normal. No respiratory distress.     Breath sounds: Normal breath sounds. No wheezing.  Chest:     Breasts:        Right: No mass, skin change or tenderness.        Left: No mass, skin change or tenderness.  Abdominal:     General: Bowel sounds are normal. There is no distension.     Palpations: Abdomen is soft.     Tenderness: There is no abdominal tenderness. There is no rebound.  Musculoskeletal: Normal range of motion.  Neurological:     Mental Status: She is alert and oriented to person, place, and time.     Cranial Nerves: No cranial nerve deficit.  Skin:    General: Skin is warm and dry.  Psychiatric:        Judgment: Judgment normal.  Vitals signs reviewed.   Assessment: Annual Exam 1. Women's annual routine gynecological examination   2. Screening for breast cancer   3. Family history of breast cancer   4. Screening for cervical cancer   5. Screen for colon cancer    Plan:            1.  Cervical Screening-  Pap smear done today  2. Breast screening- Exam annually and mammogram scheduled  3. Colonoscopy every 5 - 10 years (per GI), Hemoccult testing after age 60  4. Labs managed by PCP  5. Counseling for hormonal therapy: none  6. Counseled as to hereditary cancer testing, and she says  insurance would not approve this testing last year.  Her sister did test neg for BRCA and other hereditary cancer tests.              7. FRAX - FRAX score for assessing the 10 year probability for fracture calculated and discussed today.  Based on age and score  today, DEXA is not scheduled.  10 yr risk 5%.    F/U  Return in about 1 year (around 07/06/2019) for Annual.  Barnett Applebaum, MD, [facility], [facility] 07/06/2018  2:51 PM

## 2018-07-06 NOTE — Patient Instructions (Signed)
PAP every three years Mammogram every year    Call [phone] to schedule at [facility] Colonoscopy every 5 or 10 years Labs yearly (with PCP)

## 2018-07-11 ENCOUNTER — Encounter: Payer: Self-pay | Admitting: Obstetrics and Gynecology

## 2018-07-12 LAB — CYTOLOGY - PAP
Diagnosis: NEGATIVE
HPV (WINDOPATH): NOT DETECTED

## 2019-05-08 DIAGNOSIS — Z0189 Encounter for other specified special examinations: Secondary | ICD-10-CM | POA: Diagnosis not present

## 2019-05-10 DIAGNOSIS — Z043 Encounter for examination and observation following other accident: Secondary | ICD-10-CM | POA: Diagnosis not present

## 2019-05-10 DIAGNOSIS — Z713 Dietary counseling and surveillance: Secondary | ICD-10-CM | POA: Diagnosis not present

## 2019-05-10 DIAGNOSIS — R748 Abnormal levels of other serum enzymes: Secondary | ICD-10-CM | POA: Diagnosis not present

## 2019-08-03 DIAGNOSIS — Z0189 Encounter for other specified special examinations: Secondary | ICD-10-CM | POA: Diagnosis not present

## 2019-08-23 DIAGNOSIS — L719 Rosacea, unspecified: Secondary | ICD-10-CM | POA: Diagnosis not present

## 2019-09-01 DIAGNOSIS — L719 Rosacea, unspecified: Secondary | ICD-10-CM | POA: Diagnosis not present

## 2020-04-29 DIAGNOSIS — Z0189 Encounter for other specified special examinations: Secondary | ICD-10-CM | POA: Diagnosis not present

## 2020-04-30 DIAGNOSIS — Z043 Encounter for examination and observation following other accident: Secondary | ICD-10-CM | POA: Diagnosis not present

## 2020-04-30 DIAGNOSIS — Z713 Dietary counseling and surveillance: Secondary | ICD-10-CM | POA: Diagnosis not present

## 2020-04-30 DIAGNOSIS — E785 Hyperlipidemia, unspecified: Secondary | ICD-10-CM | POA: Diagnosis not present

## 2020-05-20 ENCOUNTER — Encounter: Payer: Self-pay | Admitting: Obstetrics & Gynecology

## 2020-05-20 DIAGNOSIS — Z1231 Encounter for screening mammogram for malignant neoplasm of breast: Secondary | ICD-10-CM | POA: Diagnosis not present

## 2020-05-23 ENCOUNTER — Other Ambulatory Visit: Payer: Self-pay | Admitting: Obstetrics & Gynecology

## 2020-05-23 NOTE — Progress Notes (Unsigned)
Normal Mammogram, and this is excellent news!  Recommend follow up yearly. At [facility] in [place], report scanned  Barnett Applebaum, MD, [facility], [facility] 05/23/2020  1:25 PM

## 2020-06-05 ENCOUNTER — Other Ambulatory Visit: Payer: Self-pay | Admitting: Obstetrics & Gynecology

## 2020-07-17 DIAGNOSIS — Z0189 Encounter for other specified special examinations: Secondary | ICD-10-CM | POA: Diagnosis not present

## 2020-10-01 ENCOUNTER — Encounter: Payer: Self-pay | Admitting: Obstetrics & Gynecology

## 2020-10-01 ENCOUNTER — Other Ambulatory Visit: Payer: Self-pay

## 2020-10-01 ENCOUNTER — Ambulatory Visit (INDEPENDENT_AMBULATORY_CARE_PROVIDER_SITE_OTHER): Payer: BC Managed Care – PPO | Admitting: Obstetrics & Gynecology

## 2020-10-01 VITALS — BP 120/80 | Ht 62.0 in | Wt 141.0 lb

## 2020-10-01 DIAGNOSIS — Z01419 Encounter for gynecological examination (general) (routine) without abnormal findings: Secondary | ICD-10-CM | POA: Diagnosis not present

## 2020-10-01 DIAGNOSIS — R7303 Prediabetes: Secondary | ICD-10-CM

## 2020-10-01 DIAGNOSIS — E7800 Pure hypercholesterolemia, unspecified: Secondary | ICD-10-CM | POA: Diagnosis not present

## 2020-10-01 DIAGNOSIS — Z1211 Encounter for screening for malignant neoplasm of colon: Secondary | ICD-10-CM | POA: Diagnosis not present

## 2020-10-01 NOTE — Progress Notes (Signed)
HPI:      [name] is a 55 y.o. [phone] who LMP was in the past, she presents today for her annual examination.  The patient has no complaints today; some urinary freq and GSI (mild). The patient is sexually active. Herlast pap: approximate date 2020 and was normal and last mammogram: approximate date 2022 and was normal.  The patient does perform self breast exams.  There is notable family history of breast or ovarian cancer in her family. Sister, age 41s at diagnosis, tested BRCS neg (not sure what year).   The patient is not taking hormone replacement therapy. Patient denies post-menopausal vaginal bleeding.  No bleeding since ablation and no menopausal sx's.  The patient has regular exercise: yes. The patient denies current symptoms of depression.    GYN Hx: Last Colonoscopy:5 years ago. Normal.  Last DEXA: never ago.    PMHx: Past Medical History:  Diagnosis Date  . Abortion in first trimester   . Family history of breast cancer    sister is cancer gene neg   . Fibroids   . History of blood clots 2013   POSSIBLE BLOOD CLOT YEARS AGO IN RIGHT EYE-PT HAS NERVE DAMAGE  . Optic neuritis 11/2011   Past Surgical History:  Procedure Laterality Date  . BUNIONECTOMY  2000   Bilateral, Dr. Elvina Mattes  . DILATATION & CURETTAGE/HYSTEROSCOPY WITH MYOSURE N/A 07/18/2015   Procedure: DILATATION & CURETTAGE/HYSTEROSCOPY WITH MYOSURE;  Surgeon: Gae Dry, MD;  Location: [facility] ORS;  Service: Gynecology;  Laterality: N/A;  . MYOMECTOMY    . NOVASURE ABLATION N/A 07/18/2015   Procedure: NOVASURE ABLATION;  Surgeon: Gae Dry, MD;  Location: [facility] ORS;  Service: Gynecology;  Laterality: N/A;  . VAGINAL DELIVERY     X 4   Family History  Problem Relation Age of Onset  . Arthritis Mother   . COPD Mother   . Hyperlipidemia Mother   . Breast cancer Sister 8       neg BRCA/panel testing  . Stroke Maternal Grandmother   . Hyperlipidemia Maternal Grandmother   . Cancer Other 30        Great Aunt - Breast   Social History   Tobacco Use  . Smoking status: Never Smoker  . Smokeless tobacco: Never Used  Vaping Use  . Vaping Use: Never used  Substance Use Topics  . Alcohol use: Yes    Comment: Occasional  . Drug use: No    Current Outpatient Medications:  .  Chlorpheniramine-Pseudoeph (PSEUDOEPHEDRINE COLD/ALLERGY PO), Take 1 tablet by mouth daily. (Patient not taking: Reported on 10/01/2020), Disp: , Rfl:  .  Cyanocobalamin (VITAMIN B 12 PO), Take 1 tablet by mouth daily. (Patient not taking: Reported on 10/01/2020), Disp: , Rfl:  .  Iron TABS, Take 1 tablet by mouth daily. (Patient not taking: Reported on 10/01/2020), Disp: , Rfl:  .  VITAMIN D, CHOLECALCIFEROL, PO, Take 1 tablet by mouth daily. (Patient not taking: Reported on 10/01/2020), Disp: , Rfl:  Allergies: Patient has no known allergies.  Review of Systems  Constitutional: Negative for chills, fever and malaise/fatigue.  HENT: Negative for congestion, sinus pain and sore throat.   Eyes: Negative for blurred vision and pain.  Respiratory: Negative for cough and wheezing.   Cardiovascular: Negative for chest pain and leg swelling.  Gastrointestinal: Negative for abdominal pain, constipation, diarrhea, heartburn, nausea and vomiting.  Genitourinary: Positive for frequency. Negative for dysuria, hematuria and urgency.  Musculoskeletal: Negative for back pain, joint  pain, myalgias and neck pain.  Skin: Negative for itching and rash.  Neurological: Negative for dizziness, tremors and weakness.  Endo/Heme/Allergies: Does not bruise/bleed easily.  Psychiatric/Behavioral: Negative for depression. The patient is not nervous/anxious and does not have insomnia.     Objective: BP 120/80   Ht 5' 2"  (1.575 m)   Wt 141 lb (64 kg)   BMI 25.79 kg/m   Filed Weights   10/01/20 1424  Weight: 141 lb (64 kg)   Body mass index is 25.79 kg/m. Physical Exam Constitutional:      General: She is not in acute distress.     Appearance: She is well-developed.  Genitourinary:     Bladder and rectum normal.     No lesions in the vagina.     No vaginal bleeding.      Right Adnexa: not tender and no mass present.    Left Adnexa: not tender and no mass present.    No cervical motion tenderness, friability, lesion or polyp.     Uterus is not enlarged.     No uterine mass detected.    Uterus is midaxial.     Pelvic exam was performed with patient in the lithotomy position.  Breasts:     Right: No mass, skin change or tenderness.     Left: No mass, skin change or tenderness.    HENT:     Head: Normocephalic and atraumatic. No laceration.     Right Ear: Hearing normal.     Left Ear: Hearing normal.     Mouth/Throat:     Pharynx: Uvula midline.  Eyes:     Pupils: Pupils are equal, round, and reactive to light.  Neck:     Thyroid: No thyromegaly.  Cardiovascular:     Rate and Rhythm: Normal rate and regular rhythm.     Heart sounds: No murmur heard. No friction rub. No gallop.   Pulmonary:     Effort: Pulmonary effort is normal. No respiratory distress.     Breath sounds: Normal breath sounds. No wheezing.  Abdominal:     General: Bowel sounds are normal. There is no distension.     Palpations: Abdomen is soft.     Tenderness: There is no abdominal tenderness. There is no rebound.  Musculoskeletal:        General: Normal range of motion.     Cervical back: Normal range of motion and neck supple.  Neurological:     Mental Status: She is alert and oriented to person, place, and time.     Cranial Nerves: No cranial nerve deficit.  Skin:    General: Skin is warm and dry.  Psychiatric:        Judgment: Judgment normal.  Vitals reviewed.     Assessment: Annual Exam 1. Women's annual routine gynecological examination   2. Screen for colon cancer   3. Hypercholesteremia   4. Pre-diabetes     Plan:            1.  Cervical Screening-  Pap smear schedule reviewed with patient, due every 3  years  2. Breast screening- Exam annually and mammogram scheduled  3. Colonoscopy every 10 years, Hemoccult testing after age 65  4. Labs reviewed from work place labs.  As high cholesterol and blood sugar, will have address w internal medicine  5. Counseling for hormonal therapy: none              6. FRAX - FRAX score for assessing the  10 year probability for fracture calculated and discussed today.  Based on age and score today, DEXA is not scheduled.   7. High cholesterol and blood sugar; also FH stroke.  Will have her start seeing internal medicine for this and as PCP for her.   8. She presents with a significant personal and/or family history of breast cancer sister dx in her 23s. Details of which can be found in her medical/family history. She does not have a previously identified BRCA and Lynch syndrome mutation in her family. Due to her personal and/or family history of cancer she is a candidate for the [facility] test(s).    Risk for cancer, genetic susceptibility discussed.  Patient has decided to research her FH and consider gene testing.  Discussed BRCA as well as Lynch syndrome and other cancer risk assessments available based on her family history and personal history. Pros and cons of testing discussed.     F/U  Return in about 1 year (around 10/01/2021) for Annual.  Barnett Applebaum, MD, [facility], [facility] 10/01/2020  3:21 PM

## 2020-10-01 NOTE — Patient Instructions (Signed)
Thank you for choosing [facility]. As part of our ongoing efforts to improve patient experience, we would appreciate your feedback. Please fill out the short survey that you will receive by mail or MyChart. Your opinion is important to [place]! -Dr Kenton Kingfisher  PAP every three years Mammogram every year    Call [phone] to schedule at [facility], due next year Colonoscopy every 5 or 10 years Labs yearly   Primary Care in the area  This is not meant to be comprehensive list, but a resource for some providers that you can call for counseling or medication needs. If you have a recommendation, please let us know.   [facility]:  [phone]               Dr Miguel Aschoff               Dr Juanetta Beets               Dr Lavon Paganini  [facility]:  [phone]  Dr Drue Stager [facility], [place]:   [phone]  Owens Loffler, MD  Waunita Schooner, MD  Ria Bush, MD  Jinny Sanders, MD  [facility], [place]: [phone]  Orland Mustard, MD   Fara Olden, MD

## 2020-10-06 ENCOUNTER — Encounter: Payer: Self-pay | Admitting: *Deleted

## 2020-12-10 DIAGNOSIS — Z0189 Encounter for other specified special examinations: Secondary | ICD-10-CM | POA: Diagnosis not present

## 2020-12-17 DIAGNOSIS — R790 Abnormal level of blood mineral: Secondary | ICD-10-CM | POA: Diagnosis not present

## 2021-01-01 ENCOUNTER — Ambulatory Visit: Payer: BC Managed Care – PPO | Admitting: Adult Health

## 2021-01-06 ENCOUNTER — Inpatient Hospital Stay: Payer: BC Managed Care – PPO | Attending: Oncology | Admitting: Oncology

## 2021-01-06 ENCOUNTER — Encounter: Payer: Self-pay | Admitting: Oncology

## 2021-01-06 ENCOUNTER — Other Ambulatory Visit: Payer: Self-pay | Admitting: *Deleted

## 2021-01-06 ENCOUNTER — Inpatient Hospital Stay: Payer: BC Managed Care – PPO

## 2021-01-06 VITALS — BP 123/81 | HR 77 | Temp 97.3°F | Resp 18 | Wt 140.2 lb

## 2021-01-06 DIAGNOSIS — R7989 Other specified abnormal findings of blood chemistry: Secondary | ICD-10-CM | POA: Insufficient documentation

## 2021-01-06 LAB — COMPREHENSIVE METABOLIC PANEL
ALT: 35 U/L (ref 0–44)
AST: 23 U/L (ref 15–41)
Albumin: 5 g/dL (ref 3.5–5.0)
Alkaline Phosphatase: 81 U/L (ref 38–126)
Anion gap: 9 (ref 5–15)
BUN: 16 mg/dL (ref 6–20)
CO2: 28 mmol/L (ref 22–32)
Calcium: 9.5 mg/dL (ref 8.9–10.3)
Chloride: 100 mmol/L (ref 98–111)
Creatinine, Ser: 0.63 mg/dL (ref 0.44–1.00)
GFR, Estimated: >60 mL/min (ref >60)
Glucose, Bld: 94 mg/dL (ref 70–99)
Potassium: 4 mmol/L (ref 3.5–5.1)
Sodium: 137 mmol/L (ref 135–145)
Total Bilirubin: 0.9 mg/dL (ref 0.3–1.2)
Total Protein: 7.9 g/dL (ref 6.5–8.1)

## 2021-01-06 LAB — CBC WITH DIFFERENTIAL/PLATELET
Abs Immature Granulocytes: 0.01 10*3/uL (ref 0.00–0.07)
Basophils Absolute: 0.1 10*3/uL (ref 0.0–0.1)
Basophils Relative: 1 %
Eosinophils Absolute: 0.1 10*3/uL (ref 0.0–0.5)
Eosinophils Relative: 2 %
HCT: 43.4 % (ref 36.0–46.0)
Hemoglobin: 15.1 g/dL — ABNORMAL HIGH (ref 12.0–15.0)
Immature Granulocytes: 0 %
Lymphocytes Relative: 35 %
Lymphs Abs: 2.8 10*3/uL (ref 0.7–4.0)
MCH: 32 pg (ref 26.0–34.0)
MCHC: 34.8 g/dL (ref 30.0–36.0)
MCV: 91.9 fL (ref 80.0–100.0)
Monocytes Absolute: 0.7 10*3/uL (ref 0.1–1.0)
Monocytes Relative: 8 %
Neutro Abs: 4.4 10*3/uL (ref 1.7–7.7)
Neutrophils Relative %: 54 %
Platelets: 327 10*3/uL (ref 150–400)
RBC: 4.72 MIL/uL (ref 3.87–5.11)
RDW: 12.3 % (ref 11.5–15.5)
WBC: 8.1 10*3/uL (ref 4.0–10.5)
nRBC: 0 % (ref 0.0–0.2)

## 2021-01-06 LAB — FERRITIN: Ferritin: 225 ng/mL (ref 11–307)

## 2021-01-06 LAB — IRON AND TIBC
Iron: 53 ug/dL (ref 28–170)
Saturation Ratios: 13 % (ref 10.4–31.8)
TIBC: 396 ug/dL (ref 250–450)
UIBC: 343 ug/dL

## 2021-01-06 LAB — SEDIMENTATION RATE: Sed Rate: 7 mm/hr (ref 0–30)

## 2021-01-06 NOTE — Progress Notes (Unsigned)
Uq [place]

## 2021-01-14 ENCOUNTER — Ambulatory Visit
Admission: RE | Admit: 2021-01-14 | Discharge: 2021-01-14 | Disposition: A | Discharge status: Home / Self Care | Payer: BC Managed Care – PPO | Source: Ambulatory Visit | Attending: Oncology | Admitting: Oncology

## 2021-01-14 ENCOUNTER — Other Ambulatory Visit: Payer: Self-pay

## 2021-01-14 DIAGNOSIS — R7989 Other specified abnormal findings of blood chemistry: Secondary | ICD-10-CM

## 2021-01-14 DIAGNOSIS — K760 Fatty (change of) liver, not elsewhere classified: Secondary | ICD-10-CM | POA: Diagnosis not present

## 2021-01-18 NOTE — Progress Notes (Signed)
Hematology/Oncology Consult note [facility] Telephone:([phone] Fax:([phone]) [phone]  Patient Care Team: Patient, No Pcp Per (Inactive) as PCP - General (General Practice)   Name of the patient: Erin Robertson  [id number]  20-Jul-1965    Reason for referral-elevated ferritin   Referring physician-Tammy Rolena Infante, FNP  Date of visit: 01/18/21   History of presenting illness- Patient is a 55 year old female with a past medical history significant for fibroids and optic neuritis referred for elevated ferritin.  She recently had blood work including CBC on 12/15/2020 which showed white cell count of 5.7, H&H of 14.7/44.5 with an MCV of 92 and a platelet count of 308.  CMP was within normal limits.  Iron saturation 30%.  AST ALT normal.  Ferritin levels mildly elevated at 305.  HFE gene testing negative.  Prior to that her ferritin was 249 in March 2022.  No family history of liver diseases.  ECOG PS- 0  Pain scale- 0   Review of systems- Review of Systems  Constitutional:  Positive for malaise/fatigue. Negative for chills, fever and weight loss.  HENT:  Negative for congestion, ear discharge and nosebleeds.   Eyes:  Negative for blurred vision.  Respiratory:  Negative for cough, hemoptysis, sputum production, shortness of breath and wheezing.   Cardiovascular:  Negative for chest pain, palpitations, orthopnea and claudication.  Gastrointestinal:  Negative for abdominal pain, blood in stool, constipation, diarrhea, heartburn, melena, nausea and vomiting.  Genitourinary:  Negative for dysuria, flank pain, frequency, hematuria and urgency.  Musculoskeletal:  Negative for back pain, joint pain and myalgias.  Skin:  Negative for rash.  Neurological:  Negative for dizziness, tingling, focal weakness, seizures, weakness and headaches.  Endo/Heme/Allergies:  Does not bruise/bleed easily.  Psychiatric/Behavioral:  Negative for depression and suicidal ideas. The patient  does not have insomnia.    No Known Allergies  Patient Active Problem List   Diagnosis Date Noted   Pre-diabetes 10/01/2020   Menorrhagia 07/18/2015   Fibroids, submucosal 07/18/2015   Polyp, uterus corpus 07/18/2015   Bloody diarrhea 06/11/2014   Annual physical exam 01/22/2014   Hypercholesteremia 03/31/2012   Optic neuritis, right 03/24/2012     Past Medical History:  Diagnosis Date   Abortion in first trimester    Family history of breast cancer    sister is cancer gene neg    Fibroids    History of blood clots 2013   POSSIBLE BLOOD CLOT YEARS AGO IN RIGHT EYE-PT HAS NERVE DAMAGE   Optic neuritis 11/2011     Past Surgical History:  Procedure Laterality Date   BUNIONECTOMY  2000   Bilateral, Dr. Elvina Mattes   DILATATION & CURETTAGE/HYSTEROSCOPY WITH MYOSURE N/A 07/18/2015   Procedure: DILATATION & CURETTAGE/HYSTEROSCOPY WITH MYOSURE;  Surgeon: Gae Dry, MD;  Location: [facility] ORS;  Service: Gynecology;  Laterality: N/A;   MYOMECTOMY     NOVASURE ABLATION N/A 07/18/2015   Procedure: NOVASURE ABLATION;  Surgeon: Gae Dry, MD;  Location: [facility] ORS;  Service: Gynecology;  Laterality: N/A;   VAGINAL DELIVERY     X 4    Social History   Socioeconomic History   Marital status: Married    Spouse name: Not on file   Number of children: 4   Years of education: Not on file   Highest education level: Not on file  Occupational History   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 No pets. Works at [employer] in [employer].      Regular Exercise -  3 times a week, 30 min at a time, treadmill    Daily  Caffeine Use:  1 cup coffee AM            Social Determinants of Health   Financial Resource Strain: Not on file  Food Insecurity: Not on file  Transportation Needs: Not on file  Physical Activity: Not on file  Stress: Not on file  Social Connections: Not on file  Intimate Partner Violence: Not on file     Family History  Problem Relation Age of Onset   Arthritis Mother    COPD Mother    Hyperlipidemia Mother    Breast cancer Sister 50       neg BRCA/panel testing   Stroke Maternal Grandmother    Hyperlipidemia Maternal Grandmother    Cancer Other 30       Great Aunt - Breast     Current Outpatient Medications:    Chlorpheniramine-Pseudoeph (PSEUDOEPHEDRINE COLD/ALLERGY PO), Take 1 tablet by mouth daily. (Patient not taking: No sig reported), Disp: , Rfl:    Cyanocobalamin (VITAMIN B 12 PO), Take 1 tablet by mouth daily. (Patient not taking: No sig reported), Disp: , Rfl:    Iron TABS, Take 1 tablet by mouth daily. (Patient not taking: No sig reported), Disp: , Rfl:    VITAMIN D, CHOLECALCIFEROL, PO, Take 1 tablet by mouth daily. (Patient not taking: No sig reported), Disp: , Rfl:    Physical exam:  Vitals:   01/06/21 1507  BP: 123/81  Pulse: 77  Resp: 18  Temp: (!) 97.3 F (36.3 C)  SpO2: 100%  Weight: 140 lb 4 oz (63.6 kg)   Physical Exam Constitutional:      General: She is not in acute distress. Cardiovascular:     Rate and Rhythm: Normal rate and regular rhythm.     Heart sounds: Normal heart sounds.  Pulmonary:     Effort: Pulmonary effort is normal.     Breath sounds: Normal breath sounds.  Abdominal:     General: Bowel sounds are normal.     Palpations: Abdomen is soft.     Comments: No palpable hepatosplenomegaly  Skin:    General: Skin is warm and dry.  Neurological:     Mental Status: She is alert and oriented to person, place, and time.       CMP Latest Ref Rng & Units 01/06/2021  Glucose 70 - 99 mg/dL 94  BUN 6 - 20 mg/dL 16   Creatinine 0.44 - 1.00 mg/dL 0.63  Sodium 135 - 145 mmol/L 137  Potassium 3.5 - 5.1 mmol/L 4.0  Chloride 98 - 111 mmol/L 100  CO2 22 - 32 mmol/L 28  Calcium 8.9 - 10.3 mg/dL 9.5  Total Protein 6.5 - 8.1 g/dL 7.9  Total Bilirubin 0.3 - 1.2 mg/dL 0.9  Alkaline Phos 38 - 126 U/L 81  AST 15 - 41 U/L 23  ALT 0 - 44 U/L 35   CBC Latest Ref Rng & Units 01/06/2021  WBC 4.0 - 10.5 K/uL 8.1  Hemoglobin 12.0 - 15.0  g/dL 15.1(H)  Hematocrit 36.0 - 46.0 % 43.4  Platelets 150 - 400 K/uL 327    No images are attached to the encounter.  US Abdomen Limited RUQ (LIVER/GB)  Result Date: 01/14/2021 CLINICAL DATA:  Elevated ferritin levels. EXAM: ULTRASOUND ABDOMEN LIMITED RIGHT UPPER QUADRANT COMPARISON:  None. FINDINGS: Gallbladder: Two small nonshadowing echogenic foci are seen along the gallbladder wall. The largest measures approximately 4.5 mm. No gallstones or wall thickening visualized (1.8 mm). No sonographic Murphy sign noted by sonographer. Common bile duct: Diameter: 4.7 mm Liver: No focal lesion identified. Diffusely increased echogenicity of the liver parenchyma is noted. Portal vein is patent on color Doppler imaging with normal direction of blood flow towards the liver. Other: None. IMPRESSION: 1. Small gallbladder polyps, likely benign. No additional follow-up imaging is recommended. This recommendation follows ACR consensus guidelines: White Paper of the ACR Incidental Findings Committee II on Gallbladder and Biliary Findings. J Am Coll Radiol 2013:;10:953-956. 2. Hepatic steatosis. Electronically Signed   By: Virgina Norfolk M.D.   On: 01/14/2021 21:00    Assessment and plan- Patient is a 55 y.o. female referred for elevated ferritin  With absence of family history of liver diseases, negative HFE testing and transferrin saturation of 30% did not feel the patient has any hereditary iron overload.  Discussed with the patient that ferritin can also be an acute phase reactant and I would  recommend repeating that at this time along with a CBC with differential CMP iron studies and ESR.  I will also obtain a right upper quadrant ultrasound and see her for a video visit thereafter   Thank you for this kind referral and the opportunity to participate in the care of this  Patient   Visit Diagnosis 1. Elevated ferritin     Dr. Randa Evens, MD, MPH [facility] at [facility] [phone] 01/18/2021

## 2021-01-20 ENCOUNTER — Encounter: Payer: Self-pay | Admitting: Oncology

## 2021-01-20 ENCOUNTER — Inpatient Hospital Stay (HOSPITAL_BASED_OUTPATIENT_CLINIC_OR_DEPARTMENT_OTHER): Payer: BC Managed Care – PPO | Admitting: Oncology

## 2021-01-20 DIAGNOSIS — R7989 Other specified abnormal findings of blood chemistry: Secondary | ICD-10-CM | POA: Diagnosis not present

## 2021-01-20 NOTE — Progress Notes (Signed)
Patient called/ pre- screened for virtual appoinment today with oncologist. No concerns voiced.

## 2021-01-28 NOTE — Progress Notes (Signed)
I connected with Erin Robertson on 01/28/21 at  4:00 PM EDT by video enabled telemedicine visit and verified that I am speaking with the correct person using two identifiers.   I discussed the limitations, risks, security and privacy concerns of performing an evaluation and management service by telemedicine and the availability of in-person appointments. I also discussed with the patient that there may be a patient responsible charge related to this service. The patient expressed understanding and agreed to proceed.  Other persons participating in the visit and their role in the encounter:  none  Patient's location:  home Provider's location:  work  Risk analyst Complaint: Routine follow-up of elevated ferritin  History of present illness: Patient is a 55 year old female with a past medical history significant for fibroids and optic neuritis referred for elevated ferritin.  She recently had blood work including CBC on 12/15/2020 which showed white cell count of 5.7, H&H of 14.7/44.5 with an MCV of 92 and a platelet count of 308.  CMP was within normal limits.  Iron saturation 30%.  AST ALT normal.  Ferritin levels mildly elevated at 305.  HFE gene testing negative.  Prior to that her ferritin was 249 in March 2022.  No family history of liver diseases.  Results of blood work from 01/06/2021 showed mildly elevated hemoglobin of 15.1 with a normal white count and platelet count.  ESR normal.  CMP normal and iron studies normal.  Ferritin levels normal at 225.  Interval history patient reports feeling at her baseline state of health.  Denies any new complaints as compared to last time.   Review of Systems  Constitutional:  Negative for chills, fever, malaise/fatigue and weight loss.  HENT:  Negative for congestion, ear discharge and nosebleeds.   Eyes:  Negative for blurred vision.  Respiratory:  Negative for cough, hemoptysis, sputum production, shortness of breath and wheezing.   Cardiovascular:   Negative for chest pain, palpitations, orthopnea and claudication.  Gastrointestinal:  Negative for abdominal pain, blood in stool, constipation, diarrhea, heartburn, melena, nausea and vomiting.  Genitourinary:  Negative for dysuria, flank pain, frequency, hematuria and urgency.  Musculoskeletal:  Negative for back pain, joint pain and myalgias.  Skin:  Negative for rash.  Neurological:  Negative for dizziness, tingling, focal weakness, seizures, weakness and headaches.  Endo/Heme/Allergies:  Does not bruise/bleed easily.  Psychiatric/Behavioral:  Negative for depression and suicidal ideas. The patient does not have insomnia.    No Known Allergies  Past Medical History:  Diagnosis Date   Abortion in first trimester    Family history of breast cancer    sister is cancer gene neg    Fibroids    History of blood clots 2013   POSSIBLE BLOOD CLOT YEARS AGO IN RIGHT EYE-PT HAS NERVE DAMAGE   Optic neuritis 11/2011    Past Surgical History:  Procedure Laterality Date   BUNIONECTOMY  2000   Bilateral, Dr. Elvina Mattes   DILATATION & CURETTAGE/HYSTEROSCOPY WITH MYOSURE N/A 07/18/2015   Procedure: DILATATION & CURETTAGE/HYSTEROSCOPY WITH MYOSURE;  Surgeon: Gae Dry, MD;  Location: [facility] ORS;  Service: Gynecology;  Laterality: N/A;   MYOMECTOMY     NOVASURE ABLATION N/A 07/18/2015   Procedure: NOVASURE ABLATION;  Surgeon: Gae Dry, MD;  Location: [facility] OR[facility];  Service: Gynecology;  Laterality: N/A;   VAGINAL DELIVERY     X 4    Social History   Socioeconomic History   Marital status: Married    Spouse name: Not on file   Number  of children: 4   Years of education: Not on file   Highest education level: Not on file  Occupational History   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 No pets. Works at [employer] in [employer].      Regular Exercise -  3 times a week, 30 min at a time, treadmill    Daily Caffeine Use:  1 cup coffee AM            Social Determinants of Health   Financial Resource Strain: Not on file  Food Insecurity: Not on file  Transportation Needs: Not on file  Physical Activity: Not on file  Stress: Not on file  Social Connections: Not on file  Intimate Partner Violence: Not on file    Family History  Problem Relation Age of Onset   Arthritis Mother    COPD Mother    Hyperlipidemia Mother    Breast cancer Sister 75       neg BRCA/panel testing   Stroke Maternal Grandmother    Hyperlipidemia Maternal Grandmother    Cancer Other 30       Great Aunt - Breast     Current Outpatient Medications:    Chlorpheniramine-Pseudoeph (PSEUDOEPHEDRINE COLD/ALLERGY PO), Take 1 tablet by mouth daily. (Patient not taking: No sig reported), Disp: , Rfl:    Cyanocobalamin (VITAMIN B 12 PO), Take 1 tablet by mouth daily. (Patient not taking: No sig reported), Disp: , Rfl:    Iron TABS, Take 1 tablet by mouth daily. (Patient not taking: No sig reported), Disp: , Rfl:    VITAMIN D, CHOLECALCIFEROL, PO, Take 1 tablet by mouth daily. (Patient not taking: No sig reported), Disp: , Rfl:   US Abdomen Limited RUQ (LIVER/GB)  Result Date: 01/14/2021 CLINICAL DATA:  Elevated ferritin levels. EXAM: ULTRASOUND ABDOMEN LIMITED RIGHT UPPER QUADRANT COMPARISON:  None. FINDINGS: Gallbladder: Two small nonshadowing echogenic foci are seen along the gallbladder wall. The largest measures approximately 4.5 mm. No gallstones or wall thickening visualized (1.8 mm). No sonographic Murphy sign noted by sonographer. Common bile duct: Diameter: 4.7 mm Liver: No focal lesion identified. Diffusely increased  echogenicity of the liver parenchyma is noted. Portal vein is patent on color Doppler imaging with normal direction of blood flow towards the liver. Other: None. IMPRESSION: 1. Small gallbladder polyps, likely benign. No additional follow-up imaging is recommended. This recommendation follows ACR consensus guidelines: White Paper of the ACR Incidental Findings Committee II on Gallbladder and Biliary Findings. J Am Coll Radiol 2013:;10:953-956. 2. Hepatic steatosis. Electronically Signed   By: Virgina Norfolk M.D.   On: 01/14/2021 21:00    No images are attached to the encounter.   CMP Latest Ref Rng & Units 01/06/2021  Glucose 70 - 99 mg/dL 94  BUN 6 - 20 mg/dL 16  Creatinine 0.44 - 1.00 mg/dL 0.63  Sodium 135 - 145 mmol/L 137  Potassium 3.5 - 5.1 mmol/L 4.0  Chloride 98 - 111 mmol/L 100  CO2 22 - 32 mmol/L 28  Calcium 8.9 - 10.3 mg/dL 9.5  Total Protein 6.5 -  8.1 g/dL 7.9  Total Bilirubin 0.3 - 1.2 mg/dL 0.9  Alkaline Phos 38 - 126 U/L 81  AST 15 - 41 U/L 23  ALT 0 - 44 U/L 35   CBC Latest Ref Rng & Units 01/06/2021  WBC 4.0 - 10.5 K/uL 8.1  Hemoglobin 12.0 - 15.0 g/dL 15.1(H)  Hematocrit 36.0 - 46.0 % 43.4  Platelets 150 - 400 K/uL 327     Observation/objective: Appears in no acute distress over video visit today.  Breathing is nonlabored  Assessment and plan:Patient is a 55 year old female referred for elevated ferritin.    Discussed the results of blood work with the patient which shows a normal CBC CMP.  Although hemoglobin was mildly elevated at 15.1 she has had normal values in the past and this can be monitored by her primary care doctor.  Her ferritin levels on repeat check were normal at 225 and with her normal LFTs this is not suggestive of iron overload.  She does not require follow-up with hematology at this time  Follow-up instructions: No follow-up.  I discussed the assessment and treatment plan with the patient. The patient was provided an opportunity to ask  questions and all were answered. The patient agreed with the plan and demonstrated an understanding of the instructions.   The patient was advised to call back or seek an in-person evaluation if the symptoms worsen or if the condition fails to improve as anticipated.   Visit Diagnosis: 1. Elevated ferritin     Dr. Randa Evens, MD, MPH [facility] at [facility] Tel- [phone] 01/28/2021 1:09 PM

## 2021-03-06 DIAGNOSIS — L600 Ingrowing nail: Secondary | ICD-10-CM | POA: Diagnosis not present

## 2021-03-06 DIAGNOSIS — M79675 Pain in left toe(s): Secondary | ICD-10-CM | POA: Diagnosis not present

## 2021-03-06 DIAGNOSIS — M79674 Pain in right toe(s): Secondary | ICD-10-CM | POA: Diagnosis not present

## 2021-03-23 DIAGNOSIS — L600 Ingrowing nail: Secondary | ICD-10-CM | POA: Diagnosis not present

## 2021-03-23 DIAGNOSIS — M79675 Pain in left toe(s): Secondary | ICD-10-CM | POA: Diagnosis not present

## 2021-03-23 DIAGNOSIS — M79674 Pain in right toe(s): Secondary | ICD-10-CM | POA: Diagnosis not present

## 2021-03-30 DIAGNOSIS — Z0189 Encounter for other specified special examinations: Secondary | ICD-10-CM | POA: Diagnosis not present

## 2021-04-13 DIAGNOSIS — L03032 Cellulitis of left toe: Secondary | ICD-10-CM | POA: Diagnosis not present

## 2021-04-13 DIAGNOSIS — L600 Ingrowing nail: Secondary | ICD-10-CM | POA: Diagnosis not present

## 2021-05-05 DIAGNOSIS — L03031 Cellulitis of right toe: Secondary | ICD-10-CM | POA: Diagnosis not present

## 2021-05-05 DIAGNOSIS — L03032 Cellulitis of left toe: Secondary | ICD-10-CM | POA: Diagnosis not present

## 2021-05-07 DIAGNOSIS — M79675 Pain in left toe(s): Secondary | ICD-10-CM | POA: Diagnosis not present

## 2021-05-07 DIAGNOSIS — Z0189 Encounter for other specified special examinations: Secondary | ICD-10-CM | POA: Diagnosis not present

## 2021-05-07 DIAGNOSIS — L03032 Cellulitis of left toe: Secondary | ICD-10-CM | POA: Diagnosis not present

## 2021-05-07 DIAGNOSIS — M79674 Pain in right toe(s): Secondary | ICD-10-CM | POA: Diagnosis not present

## 2021-05-07 DIAGNOSIS — L600 Ingrowing nail: Secondary | ICD-10-CM | POA: Diagnosis not present

## 2021-05-07 LAB — TSH: TSH: 2.73 (ref 0.41–5.90)

## 2021-05-07 LAB — CBC AND DIFFERENTIAL
HCT: 44 (ref 36–46)
Hemoglobin: 15.2 (ref 12.0–16.0)
Neutrophils Absolute: 3.9
Platelets: 361 (ref 150–399)
WBC: 6.5

## 2021-05-07 LAB — BASIC METABOLIC PANEL
BUN: 10 (ref 4–21)
Chloride: 102 (ref 99–108)
Creatinine: 0.9 (ref 0.5–1.1)
Glucose: 96
Potassium: 4.6 (ref 3.4–5.3)
Sodium: 143 (ref 137–147)

## 2021-05-07 LAB — HEPATIC FUNCTION PANEL
ALT: 29 (ref 7–35)
AST: 17 (ref 13–35)
Alkaline Phosphatase: 86 (ref 25–125)
Bilirubin, Total: 0.5

## 2021-05-07 LAB — COMPREHENSIVE METABOLIC PANEL
Albumin: 5.1 — AB (ref 3.5–5.0)
Calcium: 10.3 (ref 8.7–10.7)
GFR calc non Af Amer: 79
Globulin: 2

## 2021-05-07 LAB — VITAMIN B12: Vitamin B-12: 408

## 2021-05-07 LAB — IRON,TIBC AND FERRITIN PANEL: Iron: 110

## 2021-05-07 LAB — HEMOGLOBIN A1C: Hemoglobin A1C: 5.6

## 2021-05-07 LAB — VITAMIN D 25 HYDROXY (VIT D DEFICIENCY, FRACTURES): Vit D, 25-Hydroxy: 26.2

## 2021-05-07 LAB — CBC: RBC: 4.83 (ref 3.87–5.11)

## 2021-05-08 LAB — LIPID PANEL
Cholesterol: 208 — AB (ref 0–200)
HDL: 66 (ref 35–70)
LDL Cholesterol: 122
Triglycerides: 113 (ref 40–160)

## 2021-05-12 ENCOUNTER — Other Ambulatory Visit: Payer: Self-pay

## 2021-05-12 ENCOUNTER — Encounter: Payer: Self-pay | Admitting: Adult Health

## 2021-05-12 ENCOUNTER — Ambulatory Visit: Payer: BC Managed Care – PPO | Admitting: Adult Health

## 2021-05-12 DIAGNOSIS — R922 Inconclusive mammogram: Secondary | ICD-10-CM | POA: Diagnosis not present

## 2021-05-12 DIAGNOSIS — Z1231 Encounter for screening mammogram for malignant neoplasm of breast: Secondary | ICD-10-CM

## 2021-05-12 NOTE — Patient Instructions (Addendum)
Vitamin D 3 at 4,000 international units once daily.  Recheck vitamin D lab in 3- 4 months.   Call to schedule your screening mammogram. Your orders have been placed for your exam.  Let our office know if you have questions, concerns, or any difficulty scheduling.  If normal results then yearly screening mammograms are recommended unless you notice  Changes in your breast then you should schedule a follow up office visit. If abnormal results  Further imaging will be warranted and sooner follow up as determined by the radiologist at the [facility].   [facility] at [facility] [place], [place] [address]  Main: [phone]    Health Maintenance, Female Adopting a healthy lifestyle and getting preventive care are important in promoting health and wellness. Ask your health care provider about: The right schedule for you to have regular tests and exams. Things you can do on your own to prevent diseases and keep yourself healthy. What should I know about diet, weight, and exercise? Eat a healthy diet  Eat a diet that includes plenty of vegetables, fruits, low-fat dairy products, and lean protein. Do not eat a lot of foods that are high in solid fats, added sugars, or sodium. Maintain a healthy weight Body mass index (BMI) is used to identify weight problems. It estimates body fat based on height and weight. Your health care provider can help determine your BMI and help you achieve or maintain a healthy weight. Get regular exercise Get regular exercise. This is one of the most important things you can do for your health. Most adults should: Exercise for at least 150 minutes each week. The exercise should increase your heart rate and make you sweat (moderate-intensity exercise). Do strengthening exercises at least twice a week. This is in addition to the moderate-intensity exercise. Spend less time sitting. Even light physical activity can be  beneficial. Watch cholesterol and blood lipids Have your blood tested for lipids and cholesterol at 58 years of age, then have this test every 5 years. Have your cholesterol levels checked more often if: Your lipid or cholesterol levels are high. You are older than 57 years of age. You are at high risk for heart disease. What should I know about cancer screening? Depending on your health history and family history, you may need to have cancer screening at various ages. This may include screening for: Breast cancer. Cervical cancer. Colorectal cancer. Skin cancer. Lung cancer. What should I know about heart disease, diabetes, and high blood pressure? Blood pressure and heart disease High blood pressure causes heart disease and increases the risk of stroke. This is more likely to develop in people who have high blood pressure readings or are overweight. Have your blood pressure checked: Every 3-5 years if you are 29-58 years of age. Every year if you are 19 years old or older. Diabetes Have regular diabetes screenings. This checks your fasting blood sugar level. Have the screening done: Once every three years after age 50 if you are at a normal weight and have a low risk for diabetes. More often and at a younger age if you are overweight or have a high risk for diabetes. What should I know about preventing infection? Hepatitis B If you have a higher risk for hepatitis B, you should be screened for this virus. Talk with your health care provider to find out if you are at risk for hepatitis B infection. Hepatitis C Testing is recommended for: Everyone born  from [place] through 1965. Anyone with known risk factors for hepatitis C. Sexually transmitted infections (STIs) Get screened for STIs, including gonorrhea and chlamydia, if: You are sexually active and are younger than 57 years of age. You are older than 57 years of age and your health care provider tells you that you are at risk for  this type of infection. Your sexual activity has changed since you were last screened, and you are at increased risk for chlamydia or gonorrhea. Ask your health care provider if you are at risk. Ask your health care provider about whether you are at high risk for HIV. Your health care provider may recommend a prescription medicine to help prevent HIV infection. If you choose to take medicine to prevent HIV, you should first get tested for HIV. You should then be tested every 3 months for as long as you are taking the medicine. Pregnancy If you are about to stop having your period (premenopausal) and you may become pregnant, seek counseling before you get pregnant. Take 400 to 800 micrograms (mcg) of folic acid every day if you become pregnant. Ask for birth control (contraception) if you want to prevent pregnancy. Osteoporosis and menopause Osteoporosis is a disease in which the bones lose minerals and strength with aging. This can result in bone fractures. If you are 72 years old or older, or if you are at risk for osteoporosis and fractures, ask your health care provider if you should: Be screened for bone loss. Take a calcium or vitamin D supplement to lower your risk of fractures. Be given hormone replacement therapy (HRT) to treat symptoms of menopause. Follow these instructions at home: Alcohol use Do not drink alcohol if: Your health care provider tells you not to drink. You are pregnant, may be pregnant, or are planning to become pregnant. If you drink alcohol: Limit how much you have to: 0-1 drink a day. Know how much alcohol is in your drink. In the [place] one drink equals one 12 oz bottle of beer (355 mL), one 5 oz glass of wine (148 mL), or one 1 oz glass of hard liquor (44 mL). Lifestyle Do not use any products that contain nicotine or tobacco. These products include cigarettes, chewing tobacco, and vaping devices, such as e-cigarettes. If you need help quitting, ask your health  care provider. Do not use street drugs. Do not share needles. Ask your health care provider for help if you need support or information about quitting drugs. General instructions Schedule regular health, dental, and eye exams. Stay current with your vaccines. Tell your health care provider if: You often feel depressed. You have ever been abused or do not feel safe at home. Summary Adopting a healthy lifestyle and getting preventive care are important in promoting health and wellness. Follow your health care provider's instructions about healthy diet, exercising, and getting tested or screened for diseases. Follow your health care provider's instructions on monitoring your cholesterol and blood pressure. This information is not intended to replace advice given to you by your health care provider. Make sure you discuss any questions you have with your health care provider. Document Revised: 09/01/2020 Document Reviewed: 09/01/2020 Elsevier Patient Education  [place].

## 2021-05-12 NOTE — Progress Notes (Signed)
New Patient Office Visit  Subjective:  Patient ID: Erin Robertson, female    DOB: January 28, 1966  Age: 56 y.o. MRN: [id number]  CC:  Chief Complaint  Patient presents with   New Patient (Initial Visit)    HPI Erin Robertson presents for new patient appointment.  Vitamin D was low on labs done  05/07/2021  She had a elevated calcium of 10.3 at that time.  Cholesterol was elevated total 208. Not taking any calcium.  LDL 122. She has at [employer] program at work wellness program she had labs.   She has has been seeing Dr Erin Robertson for ingrown toenails in November 2022. She is still having redness around the skin on toe. She has seen Dr. Caryl Robertson for both great toes. She saw Dermatologist, in [place] and doxycyline for two weeks. She has not had as much pain in toe. Denies any drainage at toe. Fluconazole 200 mg for one week prescribed. Soaking in warm epson salt for a while.    Dr Erin Robertson Robertson Dr. Kenton Robertson. Normal PAP due 07/05/2021 was 3/12.2020.denies any abnormal vaginal bleeding. History of D & c and ablation and 2018.  Patient  denies any fever, body aches,chills, rash, chest pain, shortness of breath, nausea, vomiting, or diarrhea.  Denies dizziness, lightheadedness, pre syncopal or syncopal episodes.      Component 2 yr ago  Adequacy Satisfactory for evaluation  endocervical/transformation zone component PRESENT.   Diagnosis NEGATIVE FOR INTRAEPITHELIAL LESIONS OR MALIGNANCY.   HPV NOT DETECTED   Comment: Normal Reference Range - NOT Detected  Material Submitted CervicoVaginal Pap [ThinPrep Imaged]   Resulting Agency [facility]         Specimen Collected: 07/06/18 00:00       Patient  denies any fever, body aches,chills, rash, chest pain, shortness of breath, nausea, vomiting, or diarrhea.  Denies dizziness, lightheadedness, pre syncopal or syncopal episodes.    Past Medical History:  Diagnosis Date   Abortion in first trimester    Chicken pox    Family history of  breast cancer    sister is cancer gene neg    Fibroids    History of blood clots 2013   POSSIBLE BLOOD CLOT YEARS AGO IN RIGHT EYE-PT HAS NERVE DAMAGE   Hyperlipidemia    Optic neuritis 11/2011    Past Surgical History:  Procedure Laterality Date   BUNIONECTOMY  2000   Bilateral, Dr. Elvina Robertson   DILATATION & CURETTAGE/HYSTEROSCOPY WITH MYOSURE N/A 07/18/2015   Procedure: DILATATION & CURETTAGE/HYSTEROSCOPY WITH MYOSURE;  Surgeon: Erin Robertson;  Location: [facility];  Service: Gynecology;  Laterality: N/A;   MYOMECTOMY     NOVASURE ABLATION N/A 07/18/2015   Procedure: NOVASURE ABLATION;  Surgeon: Erin Robertson;  Location: [facility];  Service: Gynecology;  Laterality: N/A;   VAGINAL DELIVERY     X 4    Family History  Problem Relation Age of Onset   Stroke Mother    Arthritis Mother    COPD Mother    Hyperlipidemia Mother    Cancer Sister    Breast cancer Sister 70       neg BRCA/panel testing   Stroke Maternal Grandmother    Hyperlipidemia Maternal Grandmother    Cancer Other 100       Great Aunt - Breast    Social History   Socioeconomic History   Marital status: Married    Spouse name: Not on file   Number of children: 4   Years  of education: Not on file   Highest education level: Not on file  Occupational History   Occupation: Erin Robertson - Accounting  Tobacco Use   Smoking status: Never   Smokeless tobacco: Never  Vaping Use   Vaping Use: Never used  Substance and Sexual Activity   Alcohol use: Yes    Comment: Occasional   Drug use: No   Sexual activity: Yes    Birth control/protection: Condom  Other Topics Concern   Not on file  Social History Narrative   Lives with husband, 12YO daughter Erin Robertson in [place]. No pets. Works at [employer] in [employer].      Regular Exercise -  3 times a week, 30 min at a time, treadmill    Daily Caffeine Use:  1 cup coffee AM            Social Determinants of Health   Financial Resource Strain: Not on file   Food Insecurity: Not on file  Transportation Needs: Not on file  Physical Activity: Not on file  Stress: Not on file  Social Connections: Not on file  Intimate Partner Violence: Not on file    ROS Review of Systems  Constitutional: Negative.   HENT: Negative.    Respiratory: Negative.    Cardiovascular: Negative.   Gastrointestinal: Negative.   Genitourinary: Negative.   Musculoskeletal: Negative.    Objective:   Today's Vitals: BP 114/76    Pulse 81    Ht 5' 2.99" (1.6 m)    Wt 132 lb 12.8 oz (60.2 kg)    LMP 06/24/2016    SpO2 98%    BMI 23.53 kg/m   Physical Exam   General: Appearance:    Well developed, well nourished female in no acute distress  Eyes:    PERRL, conjunctiva/corneas clear, EOM's intact       Lungs:     Clear to auscultation bilaterally, respirations unlabored  Heart:    Normal heart rate. Normal rhythm. No murmurs, rubs, or gallops.    MS:   All extremities are intact.    Neurologic:   Awake, alert, oriented x 3. No apparent focal neurological           defect.    Bilateral great toes wrapped in bandages followed by dermatology and podiatry. Not worsening per patient. Has follow up.   Assessment & Plan:   Problem List Items Addressed This Visit       Other   Screening mammogram for breast cancer   Relevant Orders   MM DIGITAL SCREENING BILATERAL   Serum calcium elevated - Primary   Relevant Orders   PTH, Intact and Calcium   PTH-related peptide   Dense breast   Relevant Orders   MM DIGITAL SCREENING BILATERAL    Colonoscopy is due she saw Dr. Tiffany Robertson in past, she will message provider / office with where she would like to be seen for this she is unsure at this time.  Outpatient Encounter Medications as of 05/12/2021  Medication Sig   Chlorpheniramine-Pseudoeph (PSEUDOEPHEDRINE COLD/ALLERGY PO) Take 1 tablet by mouth daily. (Patient not taking: Reported on 10/01/2020)   Cyanocobalamin (VITAMIN B 12 PO) Take 1 tablet by mouth daily. (Patient  not taking: Reported on 10/01/2020)   doxycycline (VIBRA-TABS) 100 MG tablet Take 100 mg by mouth 2 (two) times daily.   Iron TABS Take 1 tablet by mouth daily. (Patient not taking: Reported on 10/01/2020)   VITAMIN D, CHOLECALCIFEROL, PO Take 1 tablet by mouth daily. (Patient  not taking: Reported on 10/01/2020)   [DISCONTINUED] fluconazole (DIFLUCAN) 200 MG tablet Take 200 mg by mouth daily.   No facility-administered encounter medications on file as of 05/12/2021.   Orders Placed This Encounter  Procedures   MM DIGITAL SCREENING BILATERAL   PTH, Intact and Calcium   PTH-related peptide    Labs to check. Schedule CPE in near future.  Keep follow up with dermatology and podiatry for bilateral great toes.  Return if any symptoms worsening at anytime.   Red Flags discussed. The patient was given clear instructions to go to ER or return to medical center if any red flags develop, symptoms do not improve, worsen or new problems develop. They verbalized understanding. Follow treatment plan from office  if not improving or any worsening within 72 hours and also return to office or open medical facility at ANYTIME if any symptoms persist, change, or worsen or you have any further concerns or questions. Call 911 immediately for emergencies.   Follow-up: Return in about 3 months (around 08/10/2021), or if symptoms worsen or fail to improve, for at any time for any worsening symptoms, Go to Emergency room/ urgent care if worse.   Marcille Buffy, FNP

## 2021-05-13 ENCOUNTER — Encounter: Payer: Self-pay | Admitting: Adult Health

## 2021-05-13 DIAGNOSIS — Z713 Dietary counseling and surveillance: Secondary | ICD-10-CM | POA: Diagnosis not present

## 2021-05-13 DIAGNOSIS — Z043 Encounter for examination and observation following other accident: Secondary | ICD-10-CM | POA: Diagnosis not present

## 2021-05-13 DIAGNOSIS — R922 Inconclusive mammogram: Secondary | ICD-10-CM | POA: Insufficient documentation

## 2021-05-17 ENCOUNTER — Encounter: Payer: Self-pay | Admitting: Adult Health

## 2021-05-18 DIAGNOSIS — Z0130 Encounter for examination of blood pressure without abnormal findings: Secondary | ICD-10-CM | POA: Diagnosis not present

## 2021-05-18 NOTE — Telephone Encounter (Signed)
Okay with this referral.

## 2021-05-19 ENCOUNTER — Ambulatory Visit: Payer: BC Managed Care – PPO | Admitting: Podiatry

## 2021-05-19 ENCOUNTER — Other Ambulatory Visit: Payer: Self-pay

## 2021-05-19 DIAGNOSIS — L600 Ingrowing nail: Secondary | ICD-10-CM | POA: Diagnosis not present

## 2021-05-26 NOTE — Progress Notes (Signed)
Subjective:  Patient ID: Erin Robertson, female    DOB: [birth year],  MRN: [id number]  Chief Complaint  Patient presents with   Ingrown Toenail    Pt stated that she had a ingrown procedure done at [facility] on Nov 11 and wants to have her toes looked at     56 y.o. female presents with the above complaint.  Patient presents with history of an ingrown nail procedure done on November 11 to [facility].  She wanted to have both of the toes looked at.  There is still some redness associated around the nail itself.  She has been on Doxy for 2 weeks and antifungal for 2 weeks without any resolve meant.  She wanted to make sure everything is healing okay.  There is no signs of regrowing of ingrown's.  Her pain scale is minimal however she is concerned about the redness associated.  She does not have any history of phenol reaction.   Review of Systems: Negative except as noted in the HPI. Denies N/V/F/Ch.  Past Medical History:  Diagnosis Date   Abortion in first trimester    Chicken pox    Family history of breast cancer    sister is cancer gene neg    Fibroids    History of blood clots 2013   POSSIBLE BLOOD CLOT YEARS AGO IN RIGHT EYE-PT HAS NERVE DAMAGE   Hyperlipidemia    Optic neuritis 11/2011    Current Outpatient Medications:    Chlorpheniramine-Pseudoeph (PSEUDOEPHEDRINE COLD/ALLERGY PO), Take 1 tablet by mouth daily. (Patient not taking: Reported on 10/01/2020), Disp: , Rfl:    Cyanocobalamin (VITAMIN B 12 PO), Take 1 tablet by mouth daily. (Patient not taking: Reported on 10/01/2020), Disp: , Rfl:    doxycycline (VIBRA-TABS) 100 MG tablet, Take 100 mg by mouth 2 (two) times daily., Disp: , Rfl:    Iron TABS, Take 1 tablet by mouth daily. (Patient not taking: Reported on 10/01/2020), Disp: , Rfl:    VITAMIN D, CHOLECALCIFEROL, PO, Take 1 tablet by mouth daily. (Patient not taking: Reported on 10/01/2020), Disp: , Rfl:   Social History   Tobacco Use  Smoking Status Never   Smokeless Tobacco Never    Allergies  Allergen Reactions   Amoxicillin-Pot Clavulanate Rash   Objective:  There were no vitals filed for this visit. There is no height or weight on file to calculate BMI. Constitutional Well developed. Well nourished.  Vascular Dorsalis pedis pulses palpable bilaterally. Posterior tibial pulses palpable bilaterally. Capillary refill normal to all digits.  No cyanosis or clubbing noted. Pedal hair growth normal.  Neurologic Normal speech. Oriented to person, place, and time. Epicritic sensation to light touch grossly present bilaterally.  Dermatologic No regrowing of the ingrown noted.  No purulent drainage expressed.  No granuloma noted.  Redness does not appear to be infectious in nature.  This appears to be more like a phenol reaction  Orthopedic: Normal joint ROM without pain or crepitus bilaterally. No visible deformities. No bony tenderness.   Radiographs: None Assessment:   1. Ingrown nail of great toe    Plan:  Patient was evaluated and treated and all questions answered.  Bilateral history of ingrown removal with underlying phenol reaction -All questions and concerns were discussed with the patient in extensive detail -I discussed with the patient that there is no signs of ingrowing recurrence at this time.  However we can continue to clinically monitor for regrowing. -I do not appreciate clinical signs of infection and  I believe the redness could likely be phenol reaction. -I discussed with the patient this can usually self resolve with time.  If it continues to bother her I have asked her to come see me immediately.  She states understanding will do so   No follow-ups on file.

## 2021-05-28 ENCOUNTER — Other Ambulatory Visit: Payer: Self-pay

## 2021-05-28 DIAGNOSIS — L718 Other rosacea: Secondary | ICD-10-CM | POA: Diagnosis not present

## 2021-05-28 DIAGNOSIS — D225 Melanocytic nevi of trunk: Secondary | ICD-10-CM | POA: Diagnosis not present

## 2021-05-28 DIAGNOSIS — Z79899 Other long term (current) drug therapy: Secondary | ICD-10-CM

## 2021-05-28 DIAGNOSIS — D2262 Melanocytic nevi of left upper limb, including shoulder: Secondary | ICD-10-CM | POA: Diagnosis not present

## 2021-05-28 DIAGNOSIS — D2261 Melanocytic nevi of right upper limb, including shoulder: Secondary | ICD-10-CM | POA: Diagnosis not present

## 2021-06-03 ENCOUNTER — Ambulatory Visit: Payer: BC Managed Care – PPO | Admitting: Sports Medicine

## 2021-06-08 ENCOUNTER — Other Ambulatory Visit: Payer: Self-pay | Admitting: Adult Health

## 2021-06-08 DIAGNOSIS — Z1231 Encounter for screening mammogram for malignant neoplasm of breast: Secondary | ICD-10-CM

## 2021-06-18 ENCOUNTER — Other Ambulatory Visit: Payer: Self-pay

## 2021-06-18 ENCOUNTER — Other Ambulatory Visit (INDEPENDENT_AMBULATORY_CARE_PROVIDER_SITE_OTHER): Payer: BC Managed Care – PPO

## 2021-06-18 DIAGNOSIS — Z79899 Other long term (current) drug therapy: Secondary | ICD-10-CM | POA: Diagnosis not present

## 2021-06-19 ENCOUNTER — Other Ambulatory Visit: Payer: BC Managed Care – PPO

## 2021-06-19 LAB — COMPREHENSIVE METABOLIC PANEL
ALT: 19 U/L (ref 0–35)
AST: 15 U/L (ref 0–37)
Albumin: 4.8 g/dL (ref 3.5–5.2)
Alkaline Phosphatase: 68 U/L (ref 39–117)
BUN: 11 mg/dL (ref 6–23)
CO2: 33 mEq/L — ABNORMAL HIGH (ref 19–32)
Calcium: 10.1 mg/dL (ref 8.4–10.5)
Chloride: 103 mEq/L (ref 96–112)
Creatinine, Ser: 0.83 mg/dL (ref 0.40–1.20)
GFR: 78.41 mL/min (ref >60.00)
Glucose, Bld: 108 mg/dL — ABNORMAL HIGH (ref 70–99)
Potassium: 4.3 mEq/L (ref 3.5–5.1)
Sodium: 140 mEq/L (ref 135–145)
Total Bilirubin: 0.5 mg/dL (ref 0.2–1.2)
Total Protein: 6.8 g/dL (ref 6.0–8.3)

## 2021-06-22 NOTE — Progress Notes (Signed)
Glucose is elevated mildly watch diet and increase exercise. PTH is within normal limits.

## 2021-06-24 ENCOUNTER — Encounter: Payer: Self-pay | Admitting: Adult Health

## 2021-06-24 ENCOUNTER — Other Ambulatory Visit: Payer: Self-pay

## 2021-06-24 ENCOUNTER — Ambulatory Visit: Payer: BC Managed Care – PPO | Admitting: Adult Health

## 2021-06-24 ENCOUNTER — Telehealth: Payer: Self-pay

## 2021-06-24 VITALS — BP 124/80 | HR 89 | Temp 98.0°F | Ht 62.0 in | Wt 128.0 lb

## 2021-06-24 DIAGNOSIS — R531 Weakness: Secondary | ICD-10-CM | POA: Insufficient documentation

## 2021-06-24 DIAGNOSIS — R110 Nausea: Secondary | ICD-10-CM | POA: Diagnosis not present

## 2021-06-24 DIAGNOSIS — R634 Abnormal weight loss: Secondary | ICD-10-CM | POA: Diagnosis not present

## 2021-06-24 DIAGNOSIS — G8929 Other chronic pain: Secondary | ICD-10-CM

## 2021-06-24 DIAGNOSIS — L03032 Cellulitis of left toe: Secondary | ICD-10-CM | POA: Diagnosis not present

## 2021-06-24 DIAGNOSIS — E559 Vitamin D deficiency, unspecified: Secondary | ICD-10-CM

## 2021-06-24 DIAGNOSIS — M4802 Spinal stenosis, cervical region: Secondary | ICD-10-CM | POA: Diagnosis not present

## 2021-06-24 DIAGNOSIS — H469 Unspecified optic neuritis: Secondary | ICD-10-CM

## 2021-06-24 DIAGNOSIS — M542 Cervicalgia: Secondary | ICD-10-CM

## 2021-06-24 LAB — URINALYSIS, ROUTINE W REFLEX MICROSCOPIC
Bilirubin Urine: NEGATIVE
Hgb urine dipstick: NEGATIVE
Ketones, ur: NEGATIVE
Leukocytes,Ua: NEGATIVE
Nitrite: NEGATIVE
RBC / HPF: NONE SEEN (ref 0–?)
Specific Gravity, Urine: 1.015 (ref 1.000–1.030)
Total Protein, Urine: NEGATIVE
Urine Glucose: 100 — AB
Urobilinogen, UA: 0.2 (ref 0.0–1.0)
pH: 5.5 (ref 5.0–8.0)

## 2021-06-24 LAB — COMPREHENSIVE METABOLIC PANEL
ALT: 15 U/L (ref 0–35)
AST: 14 U/L (ref 0–37)
Albumin: 5 g/dL (ref 3.5–5.2)
Alkaline Phosphatase: 66 U/L (ref 39–117)
BUN: 9 mg/dL (ref 6–23)
CO2: 29 mEq/L (ref 19–32)
Calcium: 10.2 mg/dL (ref 8.4–10.5)
Chloride: 103 mEq/L (ref 96–112)
Creatinine, Ser: 0.84 mg/dL (ref 0.40–1.20)
GFR: 77.28 mL/min (ref >60.00)
Glucose, Bld: 142 mg/dL — ABNORMAL HIGH (ref 70–99)
Potassium: 3.5 mEq/L (ref 3.5–5.1)
Sodium: 142 mEq/L (ref 135–145)
Total Bilirubin: 0.6 mg/dL (ref 0.2–1.2)
Total Protein: 7.3 g/dL (ref 6.0–8.3)

## 2021-06-24 LAB — TSH: TSH: 2.23 u[IU]/mL (ref 0.35–5.50)

## 2021-06-24 LAB — CBC
HCT: 43.4 % (ref 36.0–46.0)
Hemoglobin: 14.5 g/dL (ref 12.0–15.0)
MCHC: 33.3 g/dL (ref 30.0–36.0)
MCV: 92.9 fl (ref 78.0–100.0)
Platelets: 336 10*3/uL (ref 150.0–400.0)
RBC: 4.67 Mil/uL (ref 3.87–5.11)
RDW: 13 % (ref 11.5–15.5)
WBC: 8.2 10*3/uL (ref 4.0–10.5)

## 2021-06-24 LAB — SEDIMENTATION RATE: Sed Rate: 2 mm/hr (ref 0–30)

## 2021-06-24 MED ORDER — OMEPRAZOLE 20 MG PO CPDR: 20.0000 mg | DELAYED_RELEASE_CAPSULE | Freq: Every day | ORAL | 3 refills | Status: DC

## 2021-06-24 NOTE — Progress Notes (Signed)
Acute Office Visit  Subjective:    Patient ID: Erin Robertson, female    DOB: Jan 26, 1966, 56 y.o.   MRN: [id number]  Chief Complaint  Patient presents with   Follow-up    Pt with Limb weakness, starting last Thursday. Pt stated woke up, and she felt her arms and legs were " asleep " but she could not get them to wake up. Also with pain at base of neck.  Pt dx with Spinal Stenosis c6,c7 10 yrs ago.  Pt also complaining of loss of apetite and significant weight loss, about 20 lbs.     HPI Patient is in today for weakness that started last Thursday and lasted throughout the day on 06/18/21, she reports she had slept on her back that night and felt numbness in bilateral upper and lower extremities. She reports all this has resolved she still has burning in her neck - reports chronic with spinal stenosis - on MRI 10 years ago. She seen Dr. Manuella Robertson neurology and seen neurology , Dr. Nigel Robertson in neurology as well, was worked up for possible MS at that time and released she reports. She has damage previous to optic right eye. She does have chronic\ neck pain denies any worsening.  Denies any vision changes.    Denies any loss of bowel or bladder control.  Denies saddle paresthesias.  Denies radiculopathy/ paresthesias.    Vitamin D - 26.2 in January, she is not currently on vitamin D.   Denies any headache.  She denies any fevers, chills.  She feels symptoms have improved.  She feels since she had a GI virus in January her appetite has been decreased, she is eating throughout  the day.  12 lb weight loss since 01/06/21.  She was on Antifungal and antibiotics for toe infection Dr. Daylene Robertson. She is still dealing with toenail fungus. She has not been on antifungal medication since January. 2022. She is followed by podiatrist. Feels she has had chronic nausea. She has taken Gaviscon. Denies any reflux.  Endoscopy 2016- polyp removed.  Not intentionally losing weight. 5 years ago colonoscopy. No  dysplasia seen on biopsies.  Due for colonoscopy, mother had precancerous polyps.  GROSS DESCRIPTION:  A. Labeled: Descending colon polyp cold biopsy  Tissue Fragment(s): 1  Measurement: 0.3 cm  Comment: Tan tissue fragment  Entirely submitted in cassette(s): 1     Patient  denies any fever,chills, rash, chest pain, shortness of breath, vomiting, or diarrhea.   Denies dizziness, lightheadedness, pre syncopal or syncopal episodes.   Wt Readings from Last 3 Encounters:  06/24/21 128 lb (58.1 kg)  05/12/21 132 lb 12.8 oz (60.2 kg)  01/06/21 140 lb 4 oz (63.6 kg)    Past Medical History:  Diagnosis Date   Abortion in first trimester    Chicken pox    Family history of breast cancer    sister is cancer gene neg    Fibroids    History of blood clots 2013   POSSIBLE BLOOD CLOT YEARS AGO IN RIGHT EYE-PT HAS NERVE DAMAGE   Hyperlipidemia    Optic neuritis 11/2011    Past Surgical History:  Procedure Laterality Date   BUNIONECTOMY  2000   Bilateral, Dr. Elvina Robertson   DILATATION & CURETTAGE/HYSTEROSCOPY WITH MYOSURE N/A 07/18/2015   Procedure: DILATATION & CURETTAGE/HYSTEROSCOPY WITH MYOSURE;  Surgeon: Erin Dry, MD;  Location: [facility];  Service: Gynecology;  Laterality: N/A;   MYOMECTOMY     NOVASURE ABLATION N/A 07/18/2015  Procedure: NOVASURE ABLATION;  Surgeon: Erin Dry, MD;  Location: [facility];  Service: Gynecology;  Laterality: N/A;   VAGINAL DELIVERY     X 4    Family History  Problem Relation Age of Onset   Stroke Mother    Arthritis Mother    COPD Mother    Hyperlipidemia Mother    Cancer Sister    Breast cancer Sister 56       neg BRCA/panel testing   Stroke Maternal Grandmother    Hyperlipidemia Maternal Grandmother    Cancer Other 60       Great Aunt - Breast    Social History   Socioeconomic History   Marital status: Married    Spouse name: Not on file   Number of children: 4   Years of education: Not on file   Highest education level: Not  on file  Occupational History   Occupation: Chief of Staff - Accounting  Tobacco Use   Smoking status: Never   Smokeless tobacco: Never  Vaping Use   Vaping Use: Never used  Substance and Sexual Activity   Alcohol use: Yes    Comment: Occasional   Drug use: No   Sexual activity: Yes    Birth control/protection: Condom  Other Topics Concern   Not on file  Social History Narrative   Lives with husband, 12YO daughter Erin Robertson in [place]. No pets. Works at [employer] in [employer].      Regular Exercise -  3 times a week, 30 min at a time, treadmill    Daily Caffeine Use:  1 cup coffee AM            Social Determinants of Health   Financial Resource Strain: Not on file  Food Insecurity: Not on file  Transportation Needs: Not on file  Physical Activity: Not on file  Stress: Not on file  Social Connections: Not on file  Intimate Partner Violence: Not on file    Outpatient Medications Prior to Visit  Medication Sig Dispense Refill   aspirin EC 81 MG tablet Take 81 mg by mouth daily. Swallow whole.     ibuprofen (ADVIL) 400 MG tablet Take 400 mg by mouth every 6 (six) hours as needed.     Chlorpheniramine-Pseudoeph (PSEUDOEPHEDRINE COLD/ALLERGY PO) Take 1 tablet by mouth daily. (Patient not taking: Reported on 10/01/2020)     Cyanocobalamin (VITAMIN B 12 PO) Take 1 tablet by mouth daily. (Patient not taking: Reported on 10/01/2020)     doxycycline (VIBRA-TABS) 100 MG tablet Take 100 mg by mouth 2 (two) times daily. (Patient not taking: Reported on 06/24/2021)     Iron TABS Take 1 tablet by mouth daily. (Patient not taking: Reported on 10/01/2020)     VITAMIN D, CHOLECALCIFEROL, PO Take 1 tablet by mouth daily. (Patient not taking: Reported on 10/01/2020)     No facility-administered medications prior to visit.    Allergies  Allergen Reactions   Amoxicillin-Pot Clavulanate Rash    Review of Systems  Constitutional:  Positive for fatigue. Negative for activity change, appetite change,  chills, diaphoresis, fever and unexpected weight change.  HENT: Negative.    Respiratory: Negative.    Cardiovascular: Negative.   Gastrointestinal: Negative.   Genitourinary: Negative.   Musculoskeletal:  Positive for arthralgias. Negative for back pain, gait problem, joint swelling, myalgias, neck pain and neck stiffness.  Neurological: Negative.   Hematological: Negative.   Psychiatric/Behavioral: Negative.        Objective:    Physical Exam  General: Appearance:    Well developed, well nourished female in no acute distress  Eyes:    PERRL, conjunctiva/corneas clear, EOM's intact       Lungs:     Clear to auscultation bilaterally, respirations unlabored  Heart:    Normal heart rate. Normal rhythm. No murmurs, rubs, or gallops.    MS:   All extremities are intact.    Neurologic:   Awake, alert, oriented x 3. No apparent focal neurological           defect.    Abdomen: soft and non-tender without masses, organomegaly or hernias noted.  No guarding or rebound  BP 124/80 (BP Location: Left Arm, Patient Position: Sitting, Cuff Size: Small)    Pulse 89    Temp 98 F (36.7 C) (Oral)    Ht 5' 2"  (1.575 m)    Wt 128 lb (58.1 kg)    LMP 06/24/2016    SpO2 99%    BMI 23.41 kg/m  Wt Readings from Last 3 Encounters:  06/24/21 128 lb (58.1 kg)  05/12/21 132 lb 12.8 oz (60.2 kg)  01/06/21 140 lb 4 oz (63.6 kg)    Health Maintenance Due  Topic Date Due   PAP SMEAR-Modifier  07/05/2021    There are no preventive care reminders to display for this patient.   Lab Results  Component Value Date   TSH 2.23 06/24/2021   Lab Results  Component Value Date   WBC 8.2 06/24/2021   HGB 14.5 06/24/2021   HCT 43.4 06/24/2021   MCV 92.9 06/24/2021   PLT 336.0 06/24/2021   Lab Results  Component Value Date   NA 142 06/24/2021   K 3.5 06/24/2021   CO2 29 06/24/2021   GLUCOSE 142 (H) 06/24/2021   BUN 9 06/24/2021   CREATININE 0.84 06/24/2021   BILITOT 0.6 06/24/2021   ALKPHOS 66  06/24/2021   AST 14 06/24/2021   ALT 15 06/24/2021   PROT 7.3 06/24/2021   PROT 6.9 06/24/2021   ALBUMIN 5.0 06/24/2021   CALCIUM 10.2 06/24/2021   ANIONGAP 9 01/06/2021   GFR 77.28 06/24/2021   Lab Results  Component Value Date   CHOL 208 (A) 05/07/2021   Lab Results  Component Value Date   HDL 66 05/07/2021   Lab Results  Component Value Date   LDLCALC 122 05/07/2021   Lab Results  Component Value Date   TRIG 113 05/07/2021   No results found for: CHOLHDL Lab Results  Component Value Date   HGBA1C 5.6 05/07/2021       Assessment & Plan:   Problem List Items Addressed This Visit       Nervous and Auditory   Optic neuritis, right     Other   Cervical stenosis of spine   Relevant Orders   Ambulatory referral to Neurosurgery   Weight loss - Primary   Relevant Orders   Ambulatory referral to Gastroenterology   Ambulatory referral to Neurosurgery   Hepatitis, Acute (Completed)   Chronic neck pain   Relevant Medications   aspirin EC 81 MG tablet   ibuprofen (ADVIL) 400 MG tablet   Vitamin D deficiency   Nausea   Relevant Orders   Ambulatory referral to Gastroenterology   Ambulatory referral to Neurosurgery   Hepatitis, Acute (Completed)   Hepatitis C antibody   Weakness   Relevant Orders   CBC (Completed)   Comprehensive metabolic panel (Completed)   TSH (Completed)   Lactic acid, plasma (Completed)  Multiple Myeloma Panel (SPEP&IFE w/QIG) (Completed)   Anti-smooth muscle antibody, IgG (Completed)   ANA w/Reflex if Positive (Completed)   Urine Culture (Completed)   Urinalysis, Routine w reflex microscopic (Completed)   Rheumatoid factor (Completed)   Sedimentation rate (Completed)   Ambulatory referral to Neurosurgery   Hepatitis, Acute (Completed)   Hepatitis C antibody     Meds ordered this encounter  Medications   omeprazole (PRILOSEC) 20 MG capsule    Sig: Take 1 capsule (20 mg total) by mouth daily.    Dispense:  30 capsule     Refill:  3   Red Flags discussed. The patient was given clear instructions to go to ER or return to medical center if any red flags develop, symptoms do not improve, worsen or new problems develop. They verbalized understanding.    Return in about 3 weeks (around 07/15/2021), or if symptoms worsen or fail to improve, for at any time for any worsening symptoms, Go to Emergency room/ urgent care if worse.    Patient is aware that his primary care provider is leaving the office and last day will be July 16, 2021 and he will need to establish care with a new primary care provider, list is available upfront for patient to help him with establishing care or they can choose a provider of their choice.Patient verbalized understanding of all instructions given and denies any further questions at this time.   Marcille Buffy, FNP

## 2021-06-24 NOTE — Patient Instructions (Addendum)
Omeprazole Tablets What is this medication? OMEPRAZOLE (oh ME pray zol) is used to treat heartburn, stomach ulcers, reflux disease, or other conditions that cause too much stomach acid. It works by reducing the amount of acid in the stomach. It belongs to a group of medications called PPIs. This medicine may be used for other purposes; ask your health care provider or pharmacist if you have questions. COMMON BRAND NAME(S): Prilosec OTC What should I tell my care team before I take this medication? They need to know if you have any of these conditions: Chest pain Have had heartburn for over 3 months Have heartburn with dizziness, lightheadedness or sweating Liver disease Low levels of calcium, magnesium, or potassium in the blood Lupus Stomach pain Trouble swallowing Unexplained weight loss Vomiting with blood Wheezing An unusual or allergic reaction to omeprazole, other medications, foods, dyes, or preservatives Pregnant or trying to get pregnant Breast-feeding How should I use this medication? Take this medication by mouth with a glass of water. Follow the directions on the product label. Do not cut, crush or chew this medication. Swallow the tablets whole. Take this medication on an empty stomach, at least 30 minutes before breakfast. Take your medication at regular intervals. Do not take it more often than directed. Talk to your care team about the use of this medication in children. Special care may be needed. Overdosage: If you think you have taken too much of this medicine contact a poison control center or emergency room at once. NOTE: This medicine is only for you. Do not share this medicine with others. What if I miss a dose? If you miss a dose, take it as soon as you can. If it is almost time for your next dose, take only that dose. Do not take double or extra doses. What may interact with this medication? Do not take this medication with any of the  following: Atazanavir Clopidogrel Nelfinavir Rilpivirine This medication may also interact with the following: Antifungals like itraconazole, ketoconazole, and voriconazole Certain antivirals for HIV or hepatitis Certain medications that treat or prevent blood clots like warfarin Cilostazol Citalopram Cyclosporine Dasatinib Digoxin Disulfiram Diuretics Erlotinib Iron supplements Medications for anxiety, panic, and sleep like diazepam Medications for seizures like carbamazepine, phenobarbital, phenytoin Methotrexate Mycophenolate mofetil Nilotinib Rifampin St. John's wort Tacrolimus Vitamin B12 This list may not describe all possible interactions. Give your health care provider a list of all the medicines, herbs, non-prescription drugs, or dietary supplements you use. Also tell them if you smoke, drink alcohol, or use illegal drugs. Some items may interact with your medicine. What should I watch for while using this medication? It can take several days before your stomach pain gets better. Check with your care team if your condition does not start to get better, or if it gets worse. Do not treat diarrhea with over the counter products. Contact your care team if you have diarrhea that lasts more than 2 days or if it is severe and watery. You may need blood work done while you are taking this medication. Using this medication for a long time may weaken your bones. The risk of bone fractures may be increased. Talk to your care team about your bone health. Using this medication for a long time may cause growths (polyps) in the stomach. They usually don't cause any symptoms. They are usually not cancerous. Contact your care team if you notice pain or tenderness when you press your stomach, have nausea, or see bloody or black, tar-like  stools. This medication may cause a decrease in vitamin B12. You should make sure that you get enough vitamin B12 while you are taking this medication.  Discuss the foods you eat and the vitamins you take with your care team. What side effects may I notice from receiving this medication? Side effects that you should report to your care team as soon as possible: Allergic reactions--skin rash, itching, hives, swelling of the face, lips, tongue, or throat Kidney injury--decrease in the amount of urine, swelling of the ankles, hands, or feet Low magnesium level--muscle pain or cramps, unusual weakness, fatigue, fast or irregular heartbeat, tremors Low vitamin B12 level--pain, tingling, or numbness in the hands or feet, muscle weakness, dizziness, confusion, difficulty concentrating Rash on the cheeks or arms that gets worse in the sun Redness, blistering, peeling, or loosening of the skin, including inside the mouth Severe diarrhea, fever Unusual bleeding or bruising Side effects that usually do not require medical attention (report to your care team if they continue or are bothersome): Gas Headache Nausea Stomach pain Vomiting This list may not describe all possible side effects. Call your doctor for medical advice about side effects. You may report side effects to FDA at 1-800-FDA-1088. Where should I keep my medication? Keep out of the reach of children and pets. Store at room temperature between 20 and 25 degrees C (68 and 77 degrees F). Protect from light and moisture. Get rid of any unused medication after the expiration date. To get rid of medications that are no longer needed or expired: Take the medication to a medication take-back program. Check with your pharmacy or law enforcement to find a location. If you cannot return the medication, check the label or package insert to see if the medication should be thrown out in the garbage or flushed down the toilet. If you are not sure, ask your care team. If it is safe to put in the trash, empty the medication out of the container. Mix the medication with cat litter, dirt, coffee grounds, or  other unwanted substance. Seal the mixture in a bag or container. Put it in the trash. NOTE: This sheet is a summary. It may not cover all possible information. If you have questions about this medicine, talk to your doctor, pharmacist, or health care provider.  2022 Elsevier/Gold Standard (2020-07-08 00:00:00) Fatigue If you have fatigue, you feel tired all the time and have a lack of energy or a lack of motivation. Fatigue may make it difficult to start or complete tasks because of exhaustion. In general, occasional or mild fatigue is often a normal response to activity or life. However, long-lasting (chronic) or extreme fatigue may be a symptom of a medical condition. Follow these instructions at home: General instructions Watch your fatigue for any changes. Go to bed and get up at the same time every day. Avoid fatigue by pacing yourself during the day and getting enough sleep at night. Maintain a healthy weight. Medicines Take over-the-counter and prescription medicines only as told by your health care provider. Take a multivitamin, if told by your health care provider.  Do not use herbal or dietary supplements unless they are approved by your health care provider. Activity  Exercise regularly, as told by your health care provider. Use or practice techniques to help you relax, such as yoga, tai chi, meditation, or massage therapy. Eating and drinking  Avoid heavy meals in the evening. Eat a well-balanced diet, which includes lean proteins, whole grains, plenty of fruits and  vegetables, and low-fat dairy products. Avoid consuming too much caffeine. Avoid the use of alcohol. Drink enough fluid to keep your urine pale yellow. Lifestyle Change situations that cause you stress. Try to keep your work and personal schedule in balance. Do not use any products that contain nicotine or tobacco, such as cigarettes and e-cigarettes. If you need help quitting, ask your health care provider. Do  not use drugs. Contact a health care provider if: Your fatigue does not get better. You have a fever. You suddenly lose or gain weight. You have headaches. You have trouble falling asleep or sleeping through the night. You feel angry, guilty, anxious, or sad. You are unable to have a bowel movement (constipation). Your skin is dry. You have swelling in your legs or another part of your body. Get help right away if: You feel confused. Your vision is blurry. You feel faint or you pass out. You have a severe headache. You have severe pain in your abdomen, your back, or the area between your waist and hips (pelvis). You have chest pain, shortness of breath, or an irregular or fast heartbeat. You are unable to urinate, or you urinate less than normal. You have abnormal bleeding, such as bleeding from the rectum, vagina, nose, lungs, or nipples. You vomit blood. You have thoughts about hurting yourself or others. If you ever feel like you may hurt yourself or others, or have thoughts about taking your own life, get help right away. You can go to your nearest emergency department or call: Your local emergency services (911 in the [place] A suicide crisis helpline, such as the [facility] at 726-380-8802 or 988 in the [place]. This is open 24 hours a day. Summary If you have fatigue, you feel tired all the time and have a lack of energy or a lack of motivation. Fatigue may make it difficult to start or complete tasks because of exhaustion. Long-lasting (chronic) or extreme fatigue may be a symptom of a medical condition. Exercise regularly, as told by your health care provider. Change situations that cause you stress. Try to keep your work and personal schedule in balance. This information is not intended to replace advice given to you by your health care provider. Make sure you discuss any questions you have with your health care provider. Document Revised: 11/05/2020  Document Reviewed: 02/21/2020 Elsevier Patient Education  [employer]. Weakness Weakness is a lack of strength. You may feel weak all over your body (generalized), or you may feel weak in one part of your body (focal). There are many potential causes of weakness. Sometimes, the cause of your weakness may not be known. Some causes of weakness can be serious, so it is important to see your doctor. Follow these instructions at home: Activity Rest as needed. Try to get enough sleep. Most adults need 7-8 hours of sleep each night. Talk to your doctor about how much sleep you need each night. Do exercises, such as arm curls and leg raises, for 30 minutes at least 2 days a week or as told by your doctor. Think about working with a physical therapist or trainer to help you get stronger. General instructions  Take over-the-counter and prescription medicines only as told by your doctor. Eat a healthy, well-balanced diet. This includes: Proteins to build muscles, such as lean meats and fish. Fresh fruits and vegetables. Carbohydrates to boost energy, such as whole grains. Drink enough fluid to keep your pee (urine) pale yellow. Keep all  follow-up visits as told by your doctor. This is important. Contact a doctor if: Your weakness does not get better or it gets worse. Your weakness affects your ability to: Think clearly. Do your normal daily activities. Get help right away if you: Have sudden weakness on one side of your face or body. Have chest pain. Have trouble breathing or shortness of breath. Have problems with your vision. Have trouble talking or swallowing. Have trouble standing or walking. Are light-headed. Pass out (lose consciousness). Summary Weakness is a lack of strength. You may feel weak all over your body or just in one part of your body. There are many potential causes of weakness. Sometimes, the cause of your weakness may not be known. Rest as needed, and try to get  enough sleep. Most adults need 7-8 hours of sleep each night. Eat a healthy, well-balanced diet. This information is not intended to replace advice given to you by your health care provider. Make sure you discuss any questions you have with your health care provider. Document Revised: 11/16/2017 Document Reviewed: 11/16/2017 Elsevier Patient Education  [place].

## 2021-06-24 NOTE — Telephone Encounter (Signed)
CALLED PATIENT NO ANSWER LEFT VOICEMAIL FOR A CALL BACK ? ?

## 2021-06-25 LAB — URINE CULTURE
MICRO NUMBER:: 13073999
Result:: NO GROWTH
SPECIMEN QUALITY:: ADEQUATE

## 2021-06-25 LAB — HEPATITIS PANEL, ACUTE
Hep A IgM: NONREACTIVE
Hep B C IgM: NONREACTIVE
Hepatitis B Surface Ag: NONREACTIVE
Hepatitis C Ab: NONREACTIVE
SIGNAL TO CUT-OFF: <0.02 (ref <1.00)

## 2021-06-26 LAB — PTH, INTACT AND CALCIUM
Calcium: 10 mg/dL (ref 8.6–10.4)
PTH: 43 pg/mL (ref 16–77)

## 2021-06-26 LAB — LACTIC ACID, PLASMA: LACTIC ACID: 2.6 mmol/L — ABNORMAL HIGH (ref 0.4–1.8)

## 2021-06-26 LAB — RHEUMATOID FACTOR: Rheumatoid fact SerPl-aCnc: <14 IU/mL (ref <14)

## 2021-06-26 LAB — PTH-RELATED PEPTIDE: PTH-Related Protein (PTH-RP): 8 pg/mL — ABNORMAL LOW (ref 11–20)

## 2021-06-26 LAB — ANTI-SMOOTH MUSCLE ANTIBODY, IGG: Actin (Smooth Muscle) Antibody (IGG): <20 U (ref <20)

## 2021-06-29 LAB — MULTIPLE MYELOMA PANEL, SERUM
Albumin SerPl Elph-Mcnc: 4.2 g/dL (ref 2.9–4.4)
Albumin/Glob SerPl: 1.6 (ref 0.7–1.7)
Alpha 1: 0.2 g/dL (ref 0.0–0.4)
Alpha2 Glob SerPl Elph-Mcnc: 0.8 g/dL (ref 0.4–1.0)
B-Globulin SerPl Elph-Mcnc: 1 g/dL (ref 0.7–1.3)
Gamma Glob SerPl Elph-Mcnc: 0.7 g/dL (ref 0.4–1.8)
Globulin, Total: 2.7 g/dL (ref 2.2–3.9)
IgA/Immunoglobulin A, Serum: 92 mg/dL (ref 87–352)
IgG (Immunoglobin G), Serum: 574 mg/dL — ABNORMAL LOW (ref 586–1602)
IgM (Immunoglobulin M), Srm: 131 mg/dL (ref 26–217)
Total Protein: 6.9 g/dL (ref 6.0–8.5)

## 2021-06-29 LAB — ANA W/REFLEX IF POSITIVE: Anti Nuclear Antibody (ANA): NEGATIVE

## 2021-06-29 NOTE — Progress Notes (Signed)
PTH parathyroid hormone protein is low, PTH intact, calcium and recommend endocrinology referral at [facility] endocrinology as [place] office not taking new patients.

## 2021-06-29 NOTE — Progress Notes (Signed)
See other lab note as well when we call.  ?Multiple myeloma panel has positive IGG low, lactic acid elevated mildly  will need to refer to hematology at [facility] or her preference.  ?Sed rate normal.  ?RH factor is negative.  ?ANA is negative.  ?Glucose is elevated, if this was fasting or either way need to get her to return for a hemoglobin A1C at lab to rule out diabetes.  ?TSH for thyroid is within normal.  ?Acute hepatitis panel is negative as well.

## 2021-06-30 ENCOUNTER — Telehealth: Payer: Self-pay

## 2021-06-30 ENCOUNTER — Other Ambulatory Visit: Payer: Self-pay

## 2021-06-30 DIAGNOSIS — R7303 Prediabetes: Secondary | ICD-10-CM

## 2021-06-30 DIAGNOSIS — R7989 Other specified abnormal findings of blood chemistry: Secondary | ICD-10-CM

## 2021-06-30 NOTE — Telephone Encounter (Signed)
-----  Message from Doreen Beam, [place] sent at 06/29/2021  3:27 PM EST ----- ?See other lab note as well when we call.  ?Multiple myeloma panel has positive IGG low, lactic acid elevated mildly  will need to refer to hematology at [facility] or her preference.  ?Sed rate normal.  ?RH factor is negative.  ?ANA is negative.  ?Glucose is elevated, if this was fasting or either way need to get her to return for a hemoglobin A1C at lab to rule out diabetes.  ?TSH for thyroid is within normal.  ?Acute hepatitis panel is negative as well.  ?

## 2021-07-01 ENCOUNTER — Telehealth: Payer: Self-pay | Admitting: *Deleted

## 2021-07-01 ENCOUNTER — Encounter: Payer: Self-pay | Admitting: Adult Health

## 2021-07-01 NOTE — Telephone Encounter (Signed)
Mildly low igG level is not concerning. There is no M protein detected on myeloma panel. This does not require f/u with hematology. Nothing to do for mildly low IGG unless patient has recurrent infections from severe hypogammaglobulinemia which she doesn't have. Hope this helps. Regards, Dr. Janese Banks

## 2021-07-01 NOTE — Telephone Encounter (Signed)
Patient had telephone visit with Dr. Janese Banks in August of 2022 at which time no follow up was needed. She states that her PCP has again suggested she see Dr. Janese Banks for further management. ?

## 2021-07-02 ENCOUNTER — Encounter: Payer: Self-pay | Admitting: Family

## 2021-07-02 ENCOUNTER — Telehealth (INDEPENDENT_AMBULATORY_CARE_PROVIDER_SITE_OTHER): Payer: BC Managed Care – PPO | Admitting: Family

## 2021-07-02 ENCOUNTER — Other Ambulatory Visit: Payer: Self-pay

## 2021-07-02 VITALS — Temp 97.7°F | Ht 62.0 in | Wt 125.0 lb

## 2021-07-02 DIAGNOSIS — M542 Cervicalgia: Secondary | ICD-10-CM | POA: Insufficient documentation

## 2021-07-02 DIAGNOSIS — L03039 Cellulitis of unspecified toe: Secondary | ICD-10-CM

## 2021-07-02 DIAGNOSIS — M62838 Other muscle spasm: Secondary | ICD-10-CM | POA: Diagnosis not present

## 2021-07-02 MED ORDER — TIZANIDINE HCL 4 MG PO TABS: ORAL_TABLET | ORAL | 0 refills | Status: DC

## 2021-07-02 MED ORDER — CLOBETASOL PROPIONATE 0.05 % EX CREA: 1.0000 "application " | TOPICAL_CREAM | Freq: Two times a day (BID) | CUTANEOUS | 0 refills | Status: AC

## 2021-07-02 NOTE — Assessment & Plan Note (Signed)
Trial clobetasol  ?Failed doxcycyline and fluconazole oral with derm ?If no improvement please f/u back with dermatology ?Warm epsom soaks if tolerated ?

## 2021-07-02 NOTE — Assessment & Plan Note (Signed)
Warm heat to site ?Exercises to perform, handout sent to mychart ?Tizanidine trial, ibuprofen prn ?

## 2021-07-02 NOTE — Progress Notes (Signed)
New Patient Office Visit  Subjective:  Patient ID: Erin Robertson, female    DOB: [birth year]  Age: 56 y.o. MRN: [id number]  CC:  Chief Complaint  Patient presents with   New Patient (Initial Visit)    HPI Erin Robertson is here for a TOC from [place] [facility].  Prior provider was: Laverna Peace, FNP Pt is with acute concerns.   Had ingrown toenail, and went to podiatry where they used phenol that she thinks she had reaction to. She states she still has redness surrounding nail bed worse on the left great toe then right great toe. Redness on left medial side of left great toe and some very mild redness on left lateral nail bed. Was trying epsom salts with no real relief.   Has seen two different podiatrists who suggested conservative therapy.  Did see dermatologist, was started on doxycycline and given antifungal and no real improvement either although she does state dermatology said it was staph positive on a culture.  Epsom salts seem to make it worse.   Also with neck concerns, a few weeks ago with neck pain. After sleeping and waking up woke up with weakness and fatigue. She does state dx with spinal stenosis about ten years ago and felt like this may have aggravated it pressing on her nerve. That night started taking ibuprofen for five days, and weakness improved which is now much better than it was. Does feel some soreness left posterior neck upper back with a bit of a tightness/spasm. Burns at times.   Did have some weight loss early January, but wasn't eating well while taking doxycyline made her fele sick, and had GI bug early January as well which she thinks may have contributed to this as well.    Past Medical History:  Diagnosis Date   Abortion in first trimester    Chicken pox    Family history of breast cancer    sister is cancer gene neg    Fibroids    History of blood clots 2013   POSSIBLE BLOOD CLOT YEARS AGO IN RIGHT EYE-PT HAS NERVE DAMAGE    Hyperlipidemia    Optic neuritis 11/2011    Past Surgical History:  Procedure Laterality Date   BUNIONECTOMY  2000   Bilateral, Dr. Elvina Mattes   DILATATION & CURETTAGE/HYSTEROSCOPY WITH MYOSURE N/A 07/18/2015   Procedure: DILATATION & CURETTAGE/HYSTEROSCOPY WITH MYOSURE;  Surgeon: Gae Dry, MD;  Location: [facility] ORS;  Service: Gynecology;  Laterality: N/A;   MYOMECTOMY     NOVASURE ABLATION N/A 07/18/2015   Procedure: NOVASURE ABLATION;  Surgeon: Gae Dry, MD;  Location: [facility] ORS;  Service: Gynecology;  Laterality: N/A;   VAGINAL DELIVERY     X 4    Family History  Problem Relation Age of Onset   Stroke Mother    Arthritis Mother    COPD Mother    Hyperlipidemia Mother    Cancer Sister    Breast cancer Sister 18       neg BRCA/panel testing   Stroke Maternal Grandmother    Hyperlipidemia Maternal Grandmother    Cancer Other 67       Great Aunt - Breast    Social History   Socioeconomic History   Marital status: Married    Spouse name: Not on file   Number of children: 4   Years of education: Not on file   Highest education level: Not on file  Occupational History   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 No pets. Works at [employer] in [employer].      Regular Exercise -  3 times a week, 30 min at a time, treadmill    Daily Caffeine Use:  1 cup coffee AM            Social Determinants of Health   Financial Resource Strain: Not on file  Food Insecurity: Not on file  Transportation Needs: Not on file  Physical Activity: Not on file  Stress: Not on file  Social Connections: Not on file   Intimate Partner Violence: Not on file    Outpatient Medications Prior to Visit  Medication Sig Dispense Refill   cholecalciferol (VITAMIN D3) 25 MCG (1000 UNIT) tablet Take 2,000 Units by mouth daily.     ibuprofen (ADVIL) 400 MG tablet Take 400 mg by mouth every 6 (six) hours as needed.     VITAMIN D, CHOLECALCIFEROL, PO Take 1 tablet by mouth daily.     aspirin EC 81 MG tablet Take 81 mg by mouth daily. Swallow whole.     Chlorpheniramine-Pseudoeph (PSEUDOEPHEDRINE COLD/ALLERGY PO) Take 1 tablet by mouth daily. (Patient not taking: Reported on 10/01/2020)     Cyanocobalamin (VITAMIN B 12 PO) Take 1 tablet by mouth daily. (Patient not taking: Reported on 10/01/2020)     doxycycline (VIBRA-TABS) 100 MG tablet Take 100 mg by mouth 2 (two) times daily. (Patient not taking: Reported on 06/24/2021)     Iron TABS Take 1 tablet by mouth daily. (Patient not taking: Reported on 10/01/2020)     omeprazole (PRILOSEC) 20 MG capsule Take 1 capsule (20 mg total) by mouth daily. 30 capsule 3   No facility-administered medications prior to visit.    Allergies  Allergen Reactions   Amoxicillin-Pot Clavulanate Rash   Doxycycline Nausea Only   Phenol Rash    Rash to great toes /possible chemical burn    ROS Review of Systems  Constitutional:  Negative for chills and fatigue.  Respiratory:  Negative for cough and shortness of breath.   Cardiovascular:  Negative for chest pain and leg swelling.  Gastrointestinal:  Negative for diarrhea and nausea.  Genitourinary:  Negative for difficulty urinating.  Musculoskeletal:  Positive for neck pain (posterior base of neck with movement pain and tenderness/tightness). Negative for neck stiffness.  Skin:  Positive for rash (redness surrounding bil great toes intermittently with some mild edema at times as well as tenderness at times).  Psychiatric/Behavioral:  Negative for agitation and sleep disturbance.   All other systems reviewed and are negative.      Objective:    Physical Exam Constitutional:      Appearance: Normal appearance. She is normal weight.  Pulmonary:     Effort: Pulmonary effort is normal.  Neurological:     Mental Status: She is alert.      Temp 97.7 F (36.5 C)    Ht 5' 2"  (1.575 m)    Wt 125 lb (56.7 kg)    LMP 06/24/2016    BMI 22.86 kg/m  Wt Readings from Last 3  Encounters:  07/02/21 125 lb (56.7 kg)  06/24/21 128 lb (58.1 kg)  05/12/21 132 lb 12.8 oz (60.2 kg)     Health Maintenance Due  Topic Date Due   PAP SMEAR-Modifier  07/05/2021    There are no preventive care reminders to display for this patient.  Lab Results  Component Value Date   TSH 2.23 06/24/2021   Lab Results  Component Value Date   WBC 8.2 06/24/2021   HGB 14.5 06/24/2021   HCT 43.4 06/24/2021   MCV 92.9 06/24/2021   PLT 336.0 06/24/2021   Lab Results  Component Value Date   NA 142 06/24/2021   K 3.5 06/24/2021   CO2 29 06/24/2021   GLUCOSE 142 (H) 06/24/2021   BUN 9 06/24/2021   CREATININE 0.84 06/24/2021   BILITOT 0.6 06/24/2021   ALKPHOS 66 06/24/2021   AST 14 06/24/2021   ALT 15 06/24/2021   PROT 7.3 06/24/2021   PROT 6.9 06/24/2021   ALBUMIN 5.0 06/24/2021   CALCIUM 10.2 06/24/2021   ANIONGAP 9 01/06/2021   GFR 77.28 06/24/2021   Lab Results  Component Value Date   CHOL 208 (A) 05/07/2021   Lab Results  Component Value Date   HDL 66 05/07/2021   Lab Results  Component Value Date   LDLCALC 122 05/07/2021   Lab Results  Component Value Date   TRIG 113 05/07/2021   No results found for: CHOLHDL Lab Results  Component Value Date   HGBA1C 5.6 05/07/2021      Assessment & Plan:   Problem List Items Addressed This Visit       Musculoskeletal and Integument   Chronic paronychia of toe - Primary    Trial clobetasol  Failed doxcycyline and fluconazole oral with derm If no improvement please f/u back with dermatology Warm epsom soaks if tolerated      Relevant Medications   clobetasol  cream (TEMOVATE) 0.05 %     Other   Muscle spasm    Warm heat to site Exercises to perform, handout sent to mychart Tizanidine trial, ibuprofen prn      Relevant Medications   tiZANidine (ZANAFLEX) 4 MG tablet   Arthralgia of neck    Handout sent to mychart for neck arthralgia to include exercises to perform and stretching movements.  Warm heat to site/ice prn.  Consider xray if no improvement, conservative treatment in the meanwhile.  D/w pt proper posture at work        Meds ordered this encounter  Medications   clobetasol cream (TEMOVATE) 0.05 %    Sig: Apply 1 application. topically 2 (two) times daily for 10 days.    Dispense:  10 g    Refill:  0    Order Specific Question:   Supervising Provider    Answer:   BEDSOLE, AMY E [[id number]]   tiZANidine (ZANAFLEX) 4 MG tablet    Sig: Take 1/2 to 1 tablet po qhs prn muscle spasm    Dispense:  30 tablet    Refill:  0    Order Specific Question:   Supervising Provider    Answer:   Diona Browner, AMY E [[id number]]    Follow-up: Return in about 3 months (around 10/02/2021).    Eugenia Pancoast, FNP

## 2021-07-02 NOTE — Assessment & Plan Note (Signed)
Handout sent to [facility] for neck arthralgia to include exercises to perform and stretching movements.  ?Warm heat to site/ice prn.  ?Consider xray if no improvement, conservative treatment in the meanwhile.  ?D/w pt proper posture at work ? ?

## 2021-07-13 DIAGNOSIS — M79675 Pain in left toe(s): Secondary | ICD-10-CM | POA: Diagnosis not present

## 2021-07-13 DIAGNOSIS — M79674 Pain in right toe(s): Secondary | ICD-10-CM | POA: Diagnosis not present

## 2021-07-14 ENCOUNTER — Ambulatory Visit
Admission: RE | Admit: 2021-07-14 | Discharge: 2021-07-14 | Disposition: A | Discharge status: Home / Self Care | Payer: BC Managed Care – PPO | Source: Ambulatory Visit | Attending: Adult Health | Admitting: Adult Health

## 2021-07-14 ENCOUNTER — Other Ambulatory Visit: Payer: Self-pay

## 2021-07-14 DIAGNOSIS — Z1231 Encounter for screening mammogram for malignant neoplasm of breast: Secondary | ICD-10-CM | POA: Diagnosis not present

## 2021-07-16 ENCOUNTER — Inpatient Hospital Stay
Admission: RE | Admit: 2021-07-16 | Discharge: 2021-07-16 | Disposition: A | Discharge status: Home / Self Care | Payer: Self-pay | Source: Ambulatory Visit | Attending: *Deleted | Admitting: *Deleted

## 2021-07-16 ENCOUNTER — Ambulatory Visit: Payer: BC Managed Care – PPO | Admitting: Adult Health

## 2021-07-16 ENCOUNTER — Other Ambulatory Visit: Payer: Self-pay | Admitting: Adult Health

## 2021-07-16 ENCOUNTER — Other Ambulatory Visit: Payer: Self-pay | Admitting: *Deleted

## 2021-07-16 DIAGNOSIS — R928 Other abnormal and inconclusive findings on diagnostic imaging of breast: Secondary | ICD-10-CM

## 2021-07-16 DIAGNOSIS — Z1231 Encounter for screening mammogram for malignant neoplasm of breast: Secondary | ICD-10-CM

## 2021-07-16 DIAGNOSIS — N6489 Other specified disorders of breast: Secondary | ICD-10-CM

## 2021-07-16 DIAGNOSIS — R202 Paresthesia of skin: Secondary | ICD-10-CM | POA: Diagnosis not present

## 2021-07-23 ENCOUNTER — Ambulatory Visit: Payer: BC Managed Care – PPO | Admitting: Dermatology

## 2021-07-24 ENCOUNTER — Other Ambulatory Visit: Payer: Self-pay | Admitting: Family

## 2021-07-24 DIAGNOSIS — M62838 Other muscle spasm: Secondary | ICD-10-CM

## 2021-07-29 ENCOUNTER — Ambulatory Visit
Admission: RE | Admit: 2021-07-29 | Discharge: 2021-07-29 | Disposition: A | Discharge status: Home / Self Care | Payer: BC Managed Care – PPO | Source: Ambulatory Visit | Attending: Family | Admitting: Family

## 2021-07-29 ENCOUNTER — Other Ambulatory Visit: Payer: Self-pay | Admitting: Physical Medicine & Rehabilitation

## 2021-07-29 DIAGNOSIS — N6489 Other specified disorders of breast: Secondary | ICD-10-CM | POA: Diagnosis not present

## 2021-07-29 DIAGNOSIS — M542 Cervicalgia: Secondary | ICD-10-CM | POA: Diagnosis not present

## 2021-07-29 DIAGNOSIS — R928 Other abnormal and inconclusive findings on diagnostic imaging of breast: Secondary | ICD-10-CM | POA: Diagnosis not present

## 2021-07-29 DIAGNOSIS — R922 Inconclusive mammogram: Secondary | ICD-10-CM | POA: Diagnosis not present

## 2021-07-29 DIAGNOSIS — Z803 Family history of malignant neoplasm of breast: Secondary | ICD-10-CM | POA: Diagnosis not present

## 2021-08-10 ENCOUNTER — Ambulatory Visit
Admission: RE | Admit: 2021-08-10 | Discharge: 2021-08-10 | Disposition: A | Discharge status: Home / Self Care | Payer: BC Managed Care – PPO | Source: Ambulatory Visit | Attending: Physical Medicine & Rehabilitation | Admitting: Physical Medicine & Rehabilitation

## 2021-08-10 DIAGNOSIS — M2578 Osteophyte, vertebrae: Secondary | ICD-10-CM | POA: Diagnosis not present

## 2021-08-10 DIAGNOSIS — R202 Paresthesia of skin: Secondary | ICD-10-CM | POA: Diagnosis not present

## 2021-08-10 DIAGNOSIS — R200 Anesthesia of skin: Secondary | ICD-10-CM | POA: Diagnosis not present

## 2021-08-10 DIAGNOSIS — M542 Cervicalgia: Secondary | ICD-10-CM | POA: Diagnosis not present

## 2021-08-10 DIAGNOSIS — M4802 Spinal stenosis, cervical region: Secondary | ICD-10-CM | POA: Diagnosis not present

## 2021-08-12 DIAGNOSIS — M9931 Osseous stenosis of neural canal of cervical region: Secondary | ICD-10-CM | POA: Diagnosis not present

## 2021-08-12 DIAGNOSIS — G90522 Complex regional pain syndrome I of left lower limb: Secondary | ICD-10-CM | POA: Diagnosis not present

## 2021-08-12 DIAGNOSIS — M542 Cervicalgia: Secondary | ICD-10-CM | POA: Diagnosis not present

## 2021-08-12 DIAGNOSIS — M5412 Radiculopathy, cervical region: Secondary | ICD-10-CM | POA: Diagnosis not present

## 2021-08-13 ENCOUNTER — Ambulatory Visit: Payer: BC Managed Care – PPO | Admitting: Adult Health

## 2021-08-26 DIAGNOSIS — M542 Cervicalgia: Secondary | ICD-10-CM | POA: Diagnosis not present

## 2021-09-02 DIAGNOSIS — M542 Cervicalgia: Secondary | ICD-10-CM | POA: Diagnosis not present

## 2021-09-10 DIAGNOSIS — M542 Cervicalgia: Secondary | ICD-10-CM | POA: Diagnosis not present

## 2021-09-16 DIAGNOSIS — M542 Cervicalgia: Secondary | ICD-10-CM | POA: Diagnosis not present

## 2021-09-17 ENCOUNTER — Other Ambulatory Visit: Payer: Self-pay

## 2021-09-17 ENCOUNTER — Ambulatory Visit: Payer: BC Managed Care – PPO | Admitting: Gastroenterology

## 2021-09-17 ENCOUNTER — Encounter: Payer: Self-pay | Admitting: Gastroenterology

## 2021-09-17 VITALS — BP 88/53 | HR 74 | Temp 97.3°F | Wt 128.4 lb

## 2021-09-17 DIAGNOSIS — R110 Nausea: Secondary | ICD-10-CM

## 2021-09-17 DIAGNOSIS — Z1211 Encounter for screening for malignant neoplasm of colon: Secondary | ICD-10-CM

## 2021-09-17 DIAGNOSIS — R634 Abnormal weight loss: Secondary | ICD-10-CM

## 2021-09-17 MED ORDER — NA SULFATE-K SULFATE-MG SULF 17.5-3.13-1.6 GM/177ML PO SOLN: 354.0000 mL | Freq: Once | ORAL | 0 refills | Status: AC

## 2021-09-17 NOTE — Progress Notes (Signed)
[name] MD, MRCP(U.K) [address]  [place]  [place], [place] [address]  Main: [phone]  Fax: [phone]   Gastroenterology Consultation  Referring Provider:     Sharmon Leyden* Primary Care Physician:  Eugenia Pancoast, [place] Primary Gastroenterologist:  Dr. [name]  Reason for Consultation:    Weight loss and nausea    HPI:   Erin Robertson is a 56 y.o. y/o female referred for consultation & management  by  Eugenia Pancoast, [place].    She says that a few months back she started having chronic nausea after a course of antibiotics and antifungal agents for an ingrowing toenail that required a procedure.  At that point of time in addition to the nausea with no vomiting she started losing some weight about 15 pounds.  Once she stopped the antibiotics and antifungals her nauseous for all purposes almost resolved she is gaining back her weight and thinks she may be stabilizing.  Very minimal nausea at this point of time.  Her mother had colon polyps and she was told she is due for a colonoscopy at this point of time.No prior history of colon polyps no change in bowel habits.  06/24/2021 hemoglobin 14.5 g last colonoscopy was in 2016 by Dr. Vira Agar: Normal 01/14/2021: Right upper quadrant ultrasound showed hepatic steatosis and gallbladder polyps likely benign.  Past Medical History:  Diagnosis Date   Abortion in first trimester    Chicken pox    Family history of breast cancer    sister is cancer gene neg    Fibroids    History of blood clots 2013   POSSIBLE BLOOD CLOT YEARS AGO IN RIGHT EYE-PT HAS NERVE DAMAGE   Hyperlipidemia    Optic neuritis 11/2011    Past Surgical History:  Procedure Laterality Date   BUNIONECTOMY  2000   Bilateral, Dr. Elvina Mattes   DILATATION & CURETTAGE/HYSTEROSCOPY WITH MYOSURE N/A 07/18/2015   Procedure: DILATATION & CURETTAGE/HYSTEROSCOPY WITH MYOSURE;  Surgeon: Gae Dry, MD;  Location: [facility] ORS;  Service: Gynecology;   Laterality: N/A;   MYOMECTOMY     NOVASURE ABLATION N/A 07/18/2015   Procedure: NOVASURE ABLATION;  Surgeon: Gae Dry, MD;  Location: [facility] ORS;  Service: Gynecology;  Laterality: N/A;   VAGINAL DELIVERY     X 4    Prior to Admission medications   Medication Sig Start Date End Date Taking? Authorizing Provider  cholecalciferol (VITAMIN D3) 25 MCG (1000 UNIT) tablet Take 2,000 Units by mouth daily.    [provider]  ibuprofen (ADVIL) 400 MG tablet Take 400 mg by mouth every 6 (six) hours as needed.    [provider]  omeprazole (PRILOSEC) 20 MG capsule Take 1 capsule by mouth daily.    [provider]  tiZANidine (ZANAFLEX) 4 MG tablet Take 1/2 to 1 tablet po qhs prn muscle spasm 07/02/21   Eugenia Pancoast, FNP    Family History  Problem Relation Age of Onset   Stroke Mother    Arthritis Mother    COPD Mother    Hyperlipidemia Mother    Cancer Sister    Breast cancer Sister 49       neg BRCA/panel testing   Stroke Maternal Grandmother    Hyperlipidemia Maternal Grandmother    Breast cancer Other        53's   Cancer Other 73       Great Aunt - Breast     Social History  Tobacco Use   Smoking status: Never   Smokeless tobacco: Never  Vaping Use   Vaping Use: Never used  Substance Use Topics   Alcohol use: Yes    Comment: Occasional   Drug use: No    Allergies as of 09/17/2021 - Review Complete 07/02/2021  Allergen Reaction Noted   Amoxicillin-pot clavulanate Rash 03/23/2021   Doxycycline Nausea Only 07/02/2021   Phenol Rash 07/02/2021    Review of Systems:    All systems reviewed and negative except where noted in HPI.   Physical Exam:  LMP 06/24/2016  Patient's last menstrual period was 06/24/2016. Psych:  Alert and cooperative. Normal mood and affect. General:   Alert,  Well-developed, well-nourished, pleasant and cooperative in NAD Head:  Normocephalic and atraumatic. Eyes:  Sclera clear, no icterus.   Conjunctiva  pink. Lungs:  Respirations even and unlabored.  Clear throughout to auscultation.   No wheezes, crackles, or rhonchi. No acute distress. Heart:  Regular rate and rhythm; no murmurs, clicks, rubs, or gallops. Abdomen:  Normal bowel sounds.  No bruits.  Soft, non-tender and non-distended without masses, hepatosplenomegaly or hernias noted.  No guarding or rebound tenderness.    Neurologic:  Alert and oriented x3;  grossly normal neurologically. Psych:  Alert and cooperative. Normal mood and affect.  Imaging Studies: No results found.  Assessment and Plan:   Erin Robertson is a 56 y.o. y/o female has been referred for weight loss, nausea,likely related to antibiotics and anti fungals. Still has some persistent nausea. She is also due for colon cancer screening   Plan 1. EGD+colonoscopy  2. Suggest Eugenia Pancoast, FNP to monitor for weight loss and if continues to lose weight consider CT chest/abdomen and pelvis 3. Trial of Prilosec 40 mg OTC   I have discussed alternative options, risks & benefits,  which include, but are not limited to, bleeding, infection, perforation,respiratory complication & drug reaction.  The patient agrees with this plan & written consent will be obtained.     Follow up in as needed  Dr [name] MD,MRCP(U.K)

## 2021-09-22 DIAGNOSIS — M542 Cervicalgia: Secondary | ICD-10-CM | POA: Diagnosis not present

## 2021-10-23 ENCOUNTER — Ambulatory Visit
Admission: RE | Admit: 2021-10-23 | Discharge: 2021-10-23 | Disposition: A | Discharge status: Home / Self Care | Payer: BC Managed Care – PPO | Source: Ambulatory Visit | Attending: Gastroenterology | Admitting: Gastroenterology

## 2021-10-23 ENCOUNTER — Encounter
Admission: RE | Disposition: A | Discharge status: Home / Self Care | Payer: Self-pay | Source: Ambulatory Visit | Attending: Gastroenterology

## 2021-10-23 ENCOUNTER — Ambulatory Visit: Payer: BC Managed Care – PPO | Admitting: Anesthesiology

## 2021-10-23 ENCOUNTER — Encounter: Payer: Self-pay | Admitting: Gastroenterology

## 2021-10-23 DIAGNOSIS — E785 Hyperlipidemia, unspecified: Secondary | ICD-10-CM | POA: Diagnosis not present

## 2021-10-23 DIAGNOSIS — K209 Esophagitis, unspecified without bleeding: Secondary | ICD-10-CM | POA: Diagnosis not present

## 2021-10-23 DIAGNOSIS — Z1211 Encounter for screening for malignant neoplasm of colon: Secondary | ICD-10-CM | POA: Diagnosis not present

## 2021-10-23 DIAGNOSIS — R634 Abnormal weight loss: Secondary | ICD-10-CM

## 2021-10-23 DIAGNOSIS — R110 Nausea: Secondary | ICD-10-CM | POA: Diagnosis not present

## 2021-10-23 HISTORY — PX: COLONOSCOPY WITH PROPOFOL: SHX5780

## 2021-10-23 HISTORY — PX: ESOPHAGOGASTRODUODENOSCOPY: SHX5428

## 2021-10-23 SURGERY — COLONOSCOPY WITH PROPOFOL
Anesthesia: General

## 2021-10-23 MED ORDER — PROPOFOL 500 MG/50ML IV EMUL
INTRAVENOUS | Status: DC | PRN
  Administered 2021-10-23: 125 ug/kg/min via INTRAVENOUS

## 2021-10-23 MED ORDER — PROPOFOL 1000 MG/100ML IV EMUL
INTRAVENOUS | Status: AC
  Filled 2021-10-23: qty 100

## 2021-10-23 MED ORDER — SODIUM CHLORIDE 0.9 % IV SOLN
INTRAVENOUS | Status: DC
  Administered 2021-10-23: 20 mL/h via INTRAVENOUS

## 2021-10-23 MED ORDER — PHENYLEPHRINE HCL (PRESSORS) 10 MG/ML IV SOLN
INTRAVENOUS | Status: DC | PRN
  Administered 2021-10-23 (×2): 80 ug via INTRAVENOUS

## 2021-10-23 MED ORDER — PROPOFOL 10 MG/ML IV BOLUS
INTRAVENOUS | Status: DC | PRN
  Administered 2021-10-23: 100 mg via INTRAVENOUS
  Administered 2021-10-23: 30 mg via INTRAVENOUS

## 2021-10-23 MED ORDER — LIDOCAINE HCL (CARDIAC) PF 100 MG/5ML IV SOSY
PREFILLED_SYRINGE | INTRAVENOUS | Status: DC | PRN
  Administered 2021-10-23: 100 mg via INTRAVENOUS

## 2021-10-23 NOTE — Transfer of Care (Signed)
Immediate Anesthesia Transfer of Care Note  Patient: Erin Robertson  Procedure(s) Performed: COLONOSCOPY WITH PROPOFOL ESOPHAGOGASTRODUODENOSCOPY (EGD)  Patient Location: PACU  Anesthesia Type:General  Level of Consciousness: drowsy  Airway & Oxygen Therapy: Patient Spontanous Breathing and Patient connected to nasal cannula oxygen  Post-op Assessment: Report given to RN  Post vital signs: stable  Last Vitals:  Vitals Value Taken Time  BP    Temp    Pulse 71 10/23/21 0848  Resp 17 10/23/21 0848  SpO2 99 % 10/23/21 0848  Vitals shown include unvalidated device data.  Last Pain:  Vitals:   10/23/21 0709  TempSrc: Temporal  PainSc: 0-No pain         Complications: No notable events documented.

## 2021-10-23 NOTE — Op Note (Signed)
[facility] Gastroenterology Patient Name: Erin Robertson Procedure Date: 10/23/2021 8:19 AM MRN: [id number] Account #: [id number] Date of Birth: 01-Jul-1965 Admit Type: Outpatient Age: 56 Room: [facility] ENDO ROOM 4 Gender: Female Note Status: Finalized Instrument Name: Park Meo [id number] Procedure:             Colonoscopy Indications:           Screening for colorectal malignant neoplasm Providers:             [name] MD, MD Referring MD:          Eugenia Pancoast (Referring MD) Medicines:             Monitored Anesthesia Care Complications:         No immediate complications. Procedure:             Pre-Anesthesia Assessment:                        - Prior to the procedure, a History and Physical was                         performed, and patient medications, allergies and                         sensitivities were reviewed. The patient's tolerance                         of previous anesthesia was reviewed.                        - The risks and benefits of the procedure and the                         sedation options and risks were discussed with the                         patient. All questions were answered and informed                         consent was obtained.                        - ASA Grade Assessment: II - A patient with mild                         systemic disease.                        After obtaining informed consent, the colonoscope was                         passed under direct vision. Throughout the procedure,                         the patient's blood pressure, pulse, and oxygen                         saturations were monitored continuously. The                         Colonoscope was introduced through  the anus and                         advanced to the the cecum, identified by the                         appendiceal orifice. The colonoscopy was performed                         with ease. The patient tolerated the procedure well.                          The quality of the bowel preparation was excellent. Findings:      The perianal and digital rectal examinations were normal.      The entire examined colon appeared normal on direct and retroflexion       views. Impression:            - The entire examined colon is normal on direct and                         retroflexion views.                        - No specimens collected. Recommendation:        - Discharge patient to home (with escort).                        - Resume previous diet.                        - Continue present medications.                        - Repeat colonoscopy in 10 years for screening                         purposes. Procedure Code(s):     --- Professional ---                        [phone], Colonoscopy, flexible; diagnostic, including                         collection of specimen(s) by brushing or washing, when                         performed (separate procedure) Diagnosis Code(s):     --- Professional ---                        Z12.11, Encounter for screening for malignant neoplasm                         of colon CPT copyright 2019 American Medical Association. All rights reserved. The codes documented in this report are preliminary and upon coder review may  be revised to meet current compliance requirements. [name], MD [name] MD, MD 10/23/2021 8:45:28 AM This report has been signed electronically. Number of Addenda: 0 Note Initiated On: 10/23/2021 8:19 AM Scope Withdrawal Time: 0 hours 11 minutes 7 seconds  Total Procedure Duration: 0 hours 14 minutes  32 seconds  Estimated Blood Loss:  Estimated blood loss: none.      [facility]

## 2021-10-23 NOTE — Op Note (Signed)
[facility] Gastroenterology Patient Name: Erin Robertson Procedure Date: 10/23/2021 8:20 AM MRN: [id number] Account #: [id number] Date of Birth: [birth year] Admit Type: Outpatient Age: 56 Room: [facility] ENDO ROOM 4 Gender: Female Note Status: Finalized Instrument Name: Upper Endoscope [id number] Procedure:             Upper GI endoscopy Indications:           Nausea Providers:             [name] MD, MD Referring MD:          Eugenia Pancoast (Referring MD) Medicines:             Monitored Anesthesia Care Complications:         No immediate complications. Procedure:             Pre-Anesthesia Assessment:                        - Prior to the procedure, a History and Physical was                         performed, and patient medications, allergies and                         sensitivities were reviewed. The patient's tolerance                         of previous anesthesia was reviewed.                        - The risks and benefits of the procedure and the                         sedation options and risks were discussed with the                         patient. All questions were answered and informed                         consent was obtained.                        - ASA Grade Assessment: II - A patient with mild                         systemic disease.                        After obtaining informed consent, the endoscope was                         passed under direct vision. Throughout the procedure,                         the patient's blood pressure, pulse, and oxygen                         saturations were monitored continuously. The Endoscope                         was introduced through the  mouth, and advanced to the                         third part of duodenum. The upper GI endoscopy was                         accomplished with ease. The patient tolerated the                         procedure well. Findings:      The stomach was normal.       The examined duodenum was normal.      LA Grade A (one or more mucosal breaks less than 5 mm, not extending       between tops of 2 mucosal folds) esophagitis with no bleeding was found       in the lower third of the esophagus. Impression:            - Normal esophagus.                        - Normal stomach.                        - Normal examined duodenum.                        - No specimens collected. Recommendation:        - Perform a colonoscopy today.                        - Use Pepcid (famotidine) 40 mg PO daily. Procedure Code(s):     --- Professional ---                        [id number], Esophagogastroduodenoscopy, flexible,                         transoral; diagnostic, including collection of                         specimen(s) by brushing or washing, when performed                         (separate procedure) Diagnosis Code(s):     --- Professional ---                        R11.0, Nausea CPT copyright 2019 American Medical Association. All rights reserved. The codes documented in this report are preliminary and upon coder review may  be revised to meet current compliance requirements. [name], MD [name] MD, MD 10/23/2021 8:27:32 AM This report has been signed electronically. Number of Addenda: 0 Note Initiated On: 10/23/2021 8:20 AM Estimated Blood Loss:  Estimated blood loss: none.      [facility]

## 2021-10-23 NOTE — Anesthesia Preprocedure Evaluation (Signed)
Anesthesia Evaluation  Patient identified by MRN, date of birth, ID band Patient awake    Reviewed: Allergy & Precautions, NPO status , Patient's Chart, lab work & pertinent test results  Airway Mallampati: II  TM Distance: >3 FB Neck ROM: Full    Dental  (+) Teeth Intact   Pulmonary neg pulmonary ROS,    Pulmonary exam normal breath sounds clear to auscultation       Cardiovascular Exercise Tolerance: Good negative cardio ROS Normal cardiovascular exam Rhythm:Regular Rate:Normal     Neuro/Psych negative neurological ROS  negative psych ROS   GI/Hepatic negative GI ROS, Neg liver ROS,   Endo/Other  negative endocrine ROS  Renal/GU negative Renal ROS  negative genitourinary   Musculoskeletal   Abdominal Normal abdominal exam  (+)   Peds negative pediatric ROS (+)  Hematology negative hematology ROS (+)   Anesthesia Other Findings Past Medical History: No date: Abortion in first trimester No date: Chicken pox No date: Family history of breast cancer     Comment:  sister is cancer gene neg  No date: Fibroids 2013: History of blood clots     Comment:  POSSIBLE BLOOD CLOT YEARS AGO IN RIGHT EYE-PT HAS NERVE               DAMAGE No date: Hyperlipidemia 11/2011: Optic neuritis  Past Surgical History: 2000: BUNIONECTOMY     Comment:  Bilateral, Dr. Elvina Mattes 07/18/2015: DILATATION & CURETTAGE/HYSTEROSCOPY WITH MYOSURE; N/A     Comment:  Procedure: [place];  Surgeon: Gae Dry, MD;  Location: [facility]               ORS;  Service: Gynecology;  Laterality: N/A; No date: MYOMECTOMY 07/18/2015: NOVASURE ABLATION; N/A     Comment:  Procedure: NOVASURE ABLATION;  Surgeon: Gae Dry,              MD;  Location: [facility] ORS;  Service: Gynecology;                Laterality: N/A; No date: VAGINAL DELIVERY     Comment:  X 4  BMI    Body Mass Index: 22.86 kg/m       Reproductive/Obstetrics negative OB ROS                             Anesthesia Physical Anesthesia Plan  ASA: 2  Anesthesia Plan: General   Post-op Pain Management:    Induction: Intravenous  PONV Risk Score and Plan: Propofol infusion and TIVA  Airway Management Planned: Natural Airway  Additional Equipment:   Intra-op Plan:   Post-operative Plan:   Informed Consent: I have reviewed the patients History and Physical, chart, labs and discussed the procedure including the risks, benefits and alternatives for the proposed anesthesia with the patient or authorized representative who has indicated his/her understanding and acceptance.     Dental Advisory Given  Plan Discussed with: CRNA and Surgeon  Anesthesia Plan Comments:         Anesthesia Quick Evaluation

## 2021-10-23 NOTE — H&P (Signed)
Erin Bellows, MD [address], [place], [place], [place], [address] [id number] [place], [place], [place], [address] Phone: [phone]  Fax: [phone]  Primary Care Physician:  Erin Pancoast, FNP   Pre-Procedure History & Physical: HPI:  Erin Robertson is a 56 y.o. female is here for an endoscopy and colonoscopy    Past Medical History:  Diagnosis Date   Abortion in first trimester    Chicken pox    Family history of breast cancer    sister is cancer gene neg    Fibroids    History of blood clots 2013   POSSIBLE BLOOD CLOT YEARS AGO IN RIGHT EYE-PT HAS NERVE DAMAGE   Hyperlipidemia    Optic neuritis 11/2011    Past Surgical History:  Procedure Laterality Date   BUNIONECTOMY  2000   Bilateral, Dr. Elvina Robertson   DILATATION & CURETTAGE/HYSTEROSCOPY WITH MYOSURE N/A 07/18/2015   Procedure: DILATATION & CURETTAGE/HYSTEROSCOPY WITH MYOSURE;  Surgeon: Erin Dry, MD;  Location: [facility] ORS;  Service: Gynecology;  Laterality: N/A;   MYOMECTOMY     NOVASURE ABLATION N/A 07/18/2015   Procedure: NOVASURE ABLATION;  Surgeon: Erin Dry, MD;  Location: [facility] ORS;  Service: Gynecology;  Laterality: N/A;   VAGINAL DELIVERY     X 4    Prior to Admission medications   Medication Sig Start Date End Date Taking? Authorizing Provider  cholecalciferol (VITAMIN D3) 25 MCG (1000 UNIT) tablet Take 2,000 Units by mouth daily.   Yes [provider]  Melatonin 2.5 MG CHEW Chew 2.5 mg by mouth at bedtime.   Yes [provider]    Allergies as of 09/17/2021 - Review Complete 09/17/2021  Allergen Reaction Noted   Amoxicillin-pot clavulanate Rash 03/23/2021   Doxycycline Nausea Only 07/02/2021   Phenol Rash 07/02/2021    Family History  Problem Relation Age of Onset   Stroke Mother    Arthritis Mother    COPD Mother    Hyperlipidemia Mother    Cancer Sister    Breast cancer Sister 3       neg BRCA/panel testing   Stroke Maternal Grandmother     Hyperlipidemia Maternal Grandmother    Breast cancer Other        43's   Cancer Other 89       Great Aunt - Breast    Social History   Socioeconomic History   Marital status: Married    Spouse name: Not on file   Number of children: 4   Years of education: Not on file   Highest education level: Not on file  Occupational History   Occupation: Chief of Staff - Accounting  Tobacco Use   Smoking status: Never   Smokeless tobacco: Never  Vaping Use   Vaping Use: Never used  Substance and Sexual Activity   Alcohol use: Yes    Comment: Occasional   Drug use: No   Sexual activity: Yes    Birth control/protection: Condom  Other Topics Concern   Not on file  Social History Narrative   Lives with husband, 12YO daughter Erin Robertson in [place]. No pets. Works at [employer] in [employer].      Regular Exercise -  3 times a week, 30 min at a time, treadmill    Daily Caffeine Use:  1 cup coffee AM            Social Determinants of Health   Financial Resource Strain: Not on file  Food Insecurity: Not  on file  Transportation Needs: Not on file  Physical Activity: Not on file  Stress: Not on file  Social Connections: Not on file  Intimate Partner Violence: Not on file    Review of Systems: See HPI, otherwise negative ROS  Physical Exam: BP 110/77   Pulse 79   Temp (!) 96.7 F (35.9 C) (Temporal)   Resp 20   Ht 5' 2"  (1.575 m)   Wt 56.7 kg   LMP 06/24/2016   SpO2 100%   BMI 22.86 kg/m  General:   Alert,  pleasant and cooperative in NAD Head:  Normocephalic and atraumatic. Neck:  Supple; no masses or thyromegaly. Lungs:  Clear throughout to auscultation, normal respiratory effort.    Heart:  +S1, +S2, Regular rate and rhythm, No edema. Abdomen:  Soft, nontender and nondistended. Normal bowel sounds, without guarding, and without rebound.   Neurologic:  Alert and  oriented x4;  grossly normal neurologically.  Impression/Plan: Erin Robertson is here for an endoscopy and  colonoscopy  to be performed for  evaluation of nausea and colon cancer screening     Risks, benefits, limitations, and alternatives regarding endoscopy have been reviewed with the patient.  Questions have been answered.  All parties agreeable.   Erin Bellows, MD  10/23/2021, 8:08 AM

## 2021-10-23 NOTE — Anesthesia Postprocedure Evaluation (Signed)
Anesthesia Post Note  Patient: Erin Robertson  Procedure(s) Performed: COLONOSCOPY WITH PROPOFOL ESOPHAGOGASTRODUODENOSCOPY (EGD)  Patient location during evaluation: PACU Anesthesia Type: General Level of consciousness: awake and oriented Pain management: satisfactory to patient Vital Signs Assessment: post-procedure vital signs reviewed and stable Respiratory status: nonlabored ventilation and respiratory function stable Cardiovascular status: stable Anesthetic complications: no   No notable events documented.   Last Vitals:  Vitals:   10/23/21 0849 10/23/21 0926  BP: (!) 89/49 114/70  Pulse:    Resp:    Temp:    SpO2:      Last Pain:  Vitals:   10/23/21 0926  TempSrc:   PainSc: 0-No pain                 VAN STAVEREN,Leahann Lempke

## 2021-11-18 DIAGNOSIS — L600 Ingrowing nail: Secondary | ICD-10-CM | POA: Diagnosis not present

## 2021-11-18 DIAGNOSIS — H53131 Sudden visual loss, right eye: Secondary | ICD-10-CM | POA: Diagnosis not present

## 2021-11-18 DIAGNOSIS — M542 Cervicalgia: Secondary | ICD-10-CM | POA: Diagnosis not present

## 2021-11-18 DIAGNOSIS — G629 Polyneuropathy, unspecified: Secondary | ICD-10-CM | POA: Diagnosis not present

## 2021-12-07 DIAGNOSIS — E559 Vitamin D deficiency, unspecified: Secondary | ICD-10-CM | POA: Diagnosis not present

## 2021-12-07 DIAGNOSIS — G629 Polyneuropathy, unspecified: Secondary | ICD-10-CM | POA: Diagnosis not present

## 2021-12-11 DIAGNOSIS — G629 Polyneuropathy, unspecified: Secondary | ICD-10-CM | POA: Diagnosis not present

## 2021-12-21 DIAGNOSIS — R200 Anesthesia of skin: Secondary | ICD-10-CM | POA: Diagnosis not present

## 2021-12-21 DIAGNOSIS — R202 Paresthesia of skin: Secondary | ICD-10-CM | POA: Diagnosis not present

## 2021-12-29 DIAGNOSIS — R202 Paresthesia of skin: Secondary | ICD-10-CM | POA: Diagnosis not present

## 2021-12-29 DIAGNOSIS — R200 Anesthesia of skin: Secondary | ICD-10-CM | POA: Diagnosis not present

## 2022-01-01 DIAGNOSIS — G629 Polyneuropathy, unspecified: Secondary | ICD-10-CM | POA: Diagnosis not present

## 2022-02-18 DIAGNOSIS — L600 Ingrowing nail: Secondary | ICD-10-CM | POA: Diagnosis not present

## 2022-02-18 DIAGNOSIS — E559 Vitamin D deficiency, unspecified: Secondary | ICD-10-CM | POA: Diagnosis not present

## 2022-02-18 DIAGNOSIS — G629 Polyneuropathy, unspecified: Secondary | ICD-10-CM | POA: Diagnosis not present

## 2022-02-18 DIAGNOSIS — M542 Cervicalgia: Secondary | ICD-10-CM | POA: Diagnosis not present

## 2022-06-10 DIAGNOSIS — D2261 Melanocytic nevi of right upper limb, including shoulder: Secondary | ICD-10-CM | POA: Diagnosis not present

## 2022-06-10 DIAGNOSIS — D225 Melanocytic nevi of trunk: Secondary | ICD-10-CM | POA: Diagnosis not present

## 2022-06-10 DIAGNOSIS — L918 Other hypertrophic disorders of the skin: Secondary | ICD-10-CM | POA: Diagnosis not present

## 2022-06-10 DIAGNOSIS — L718 Other rosacea: Secondary | ICD-10-CM | POA: Diagnosis not present

## 2022-07-05 ENCOUNTER — Other Ambulatory Visit: Payer: Self-pay | Admitting: Family

## 2022-07-05 DIAGNOSIS — Z1231 Encounter for screening mammogram for malignant neoplasm of breast: Secondary | ICD-10-CM

## 2022-08-10 ENCOUNTER — Ambulatory Visit
Admission: RE | Admit: 2022-08-10 | Discharge: 2022-08-10 | Disposition: A | Discharge status: Home / Self Care | Payer: BC Managed Care – PPO | Source: Ambulatory Visit | Attending: Family | Admitting: Family

## 2022-08-10 DIAGNOSIS — Z1231 Encounter for screening mammogram for malignant neoplasm of breast: Secondary | ICD-10-CM | POA: Diagnosis not present

## 2022-08-24 IMAGING — MG MM DIGITAL DIAGNOSTIC UNILAT*L* W/ TOMO W/ CAD
4 series · 4 of 12 positions shown · non-contrast
Comparison: Previous exam(s).

CLINICAL DATA: Callback for LEFT breast asymmetry seen on CC view
only. Strong family history of breast cancer

EXAM:
DIGITAL DIAGNOSTIC UNILATERAL LEFT MAMMOGRAM WITH TOMOSYNTHESIS AND
CAD; ULTRASOUND LEFT BREAST LIMITED
TECHNIQUE: Left digital diagnostic mammography and breast tomosynthesis was
performed. The images were evaluated with computer-aided detection.;
Targeted ultrasound examination of the left breast was performed.

[L ML synth-2D]
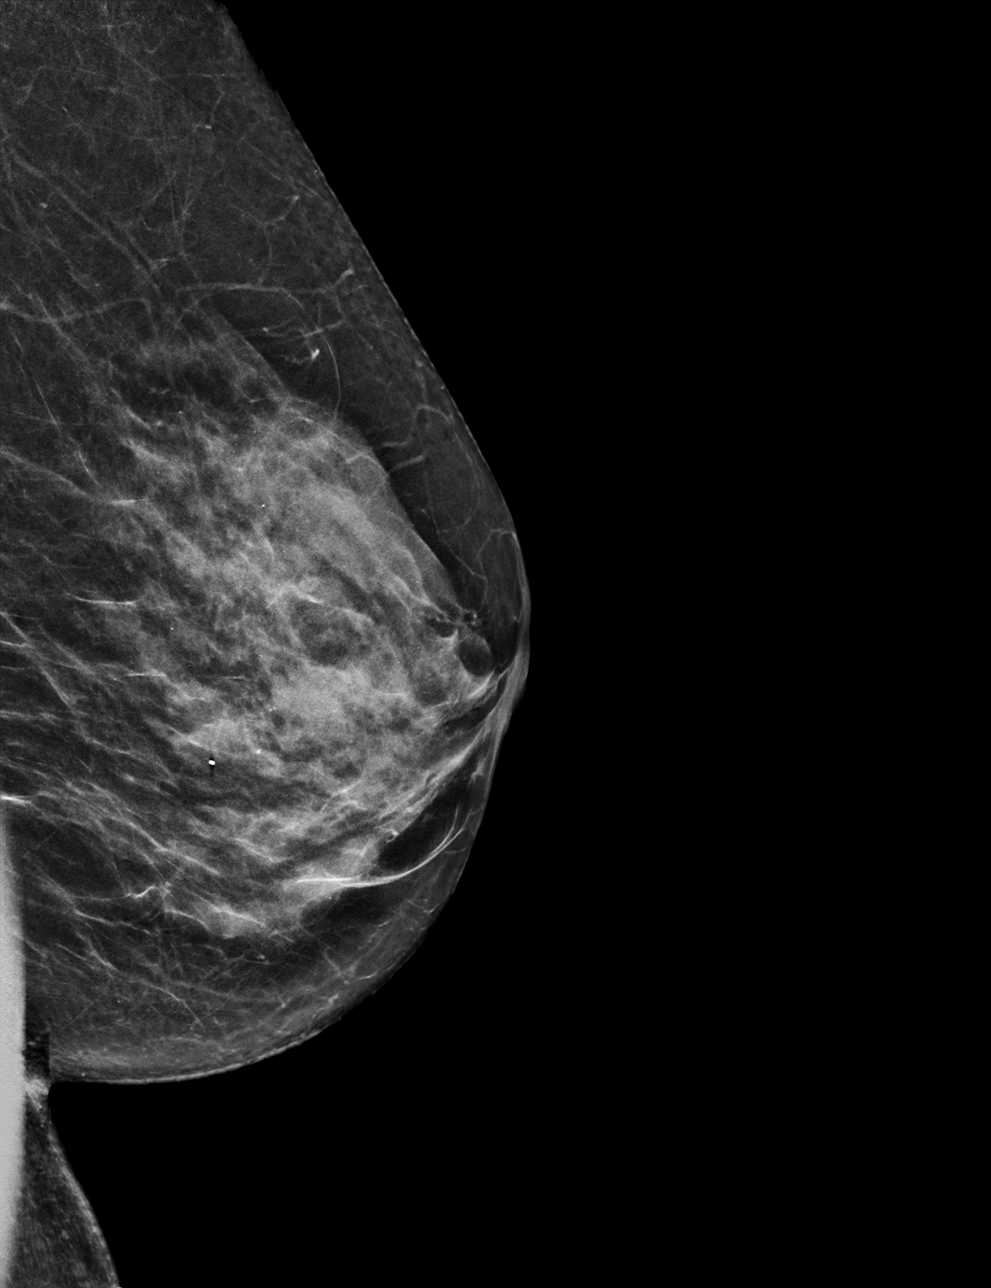

[L CC synth-2D]
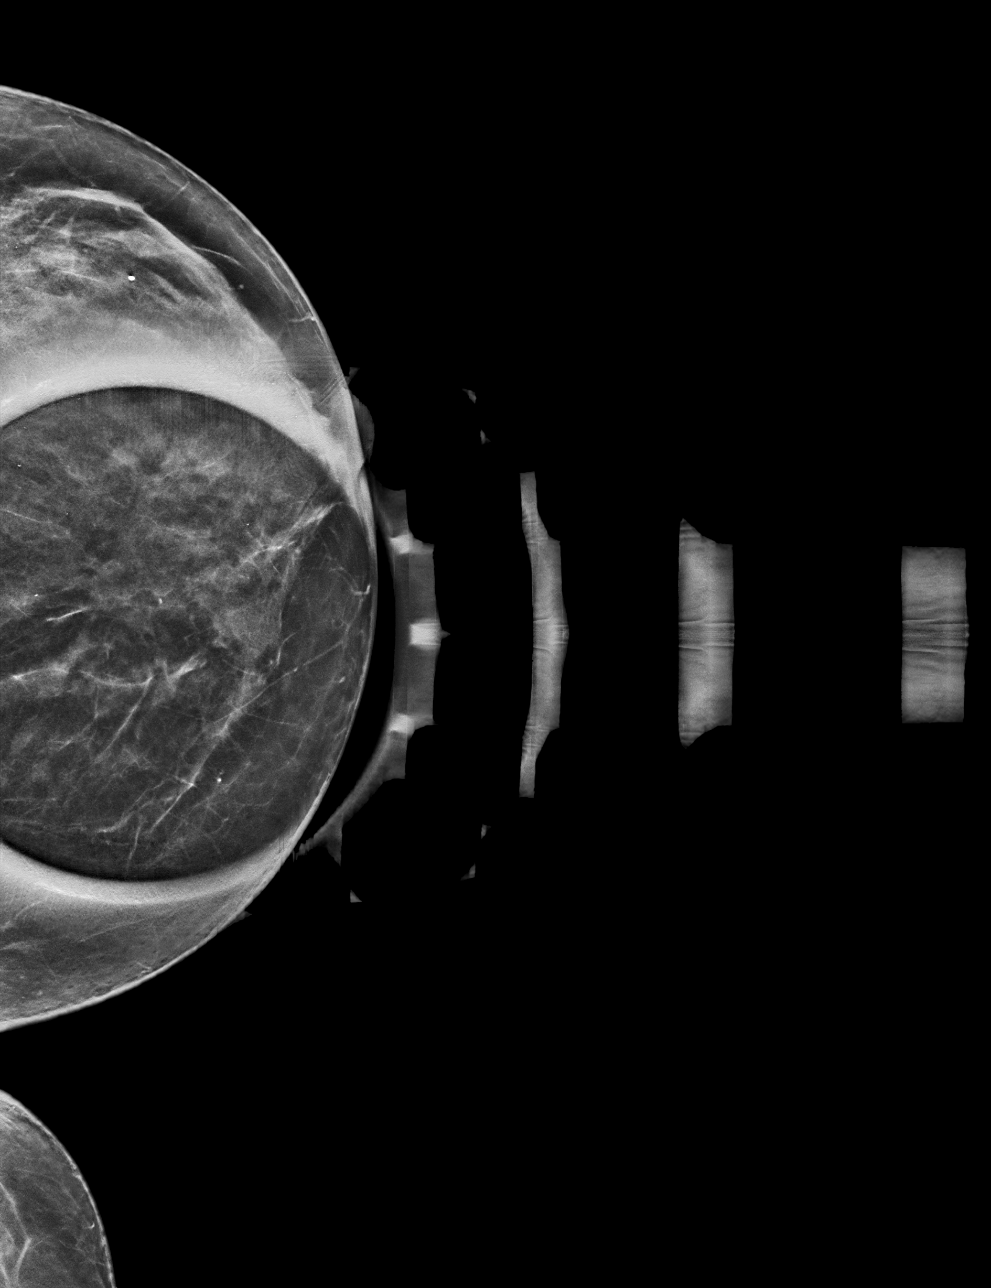

[L ML tomo · tomo slice 27/53.0]
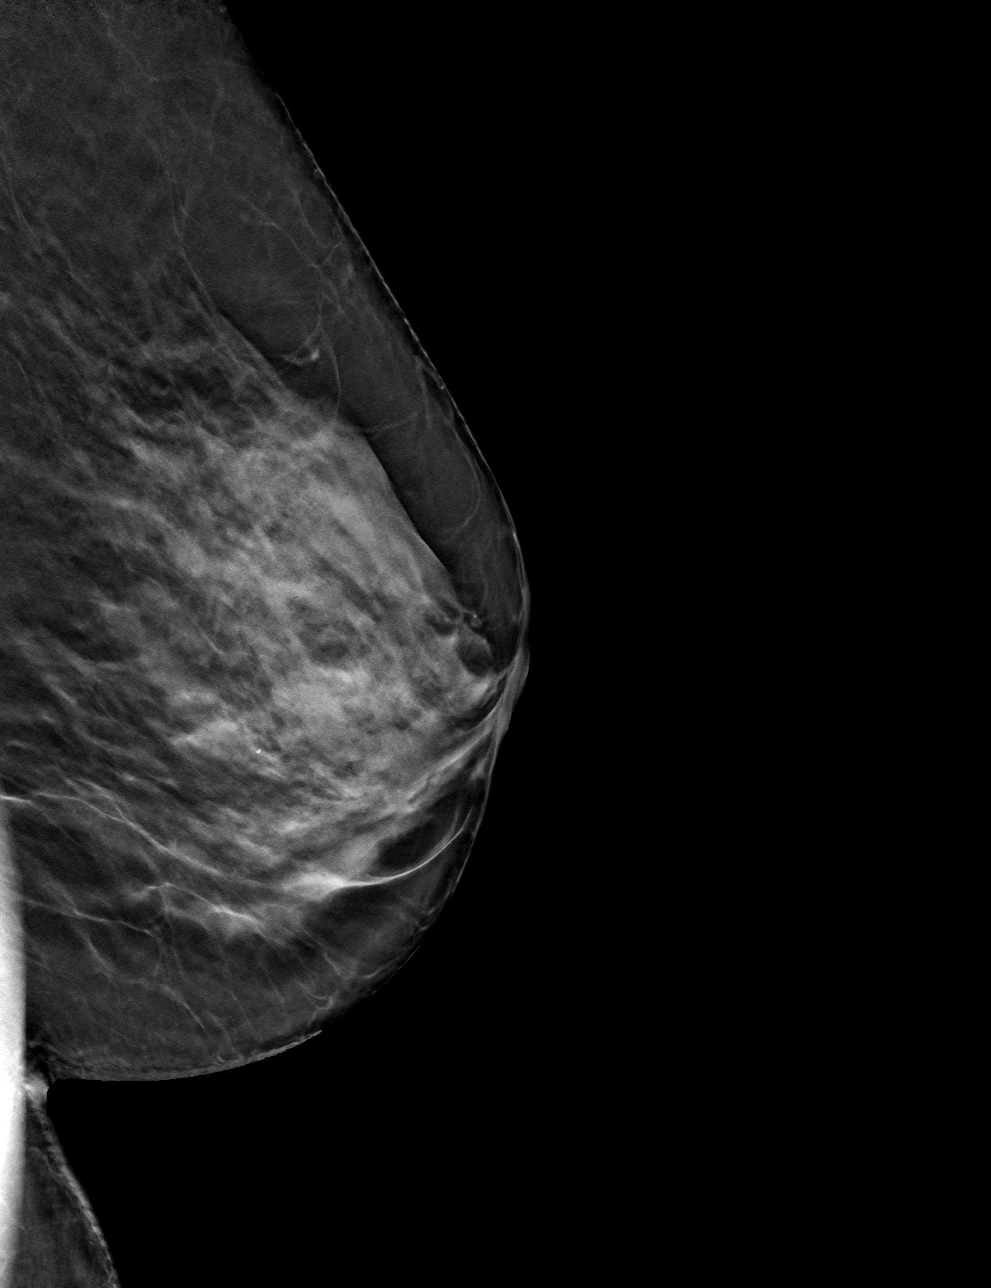

[L CC tomo · tomo slice 25/48.0]
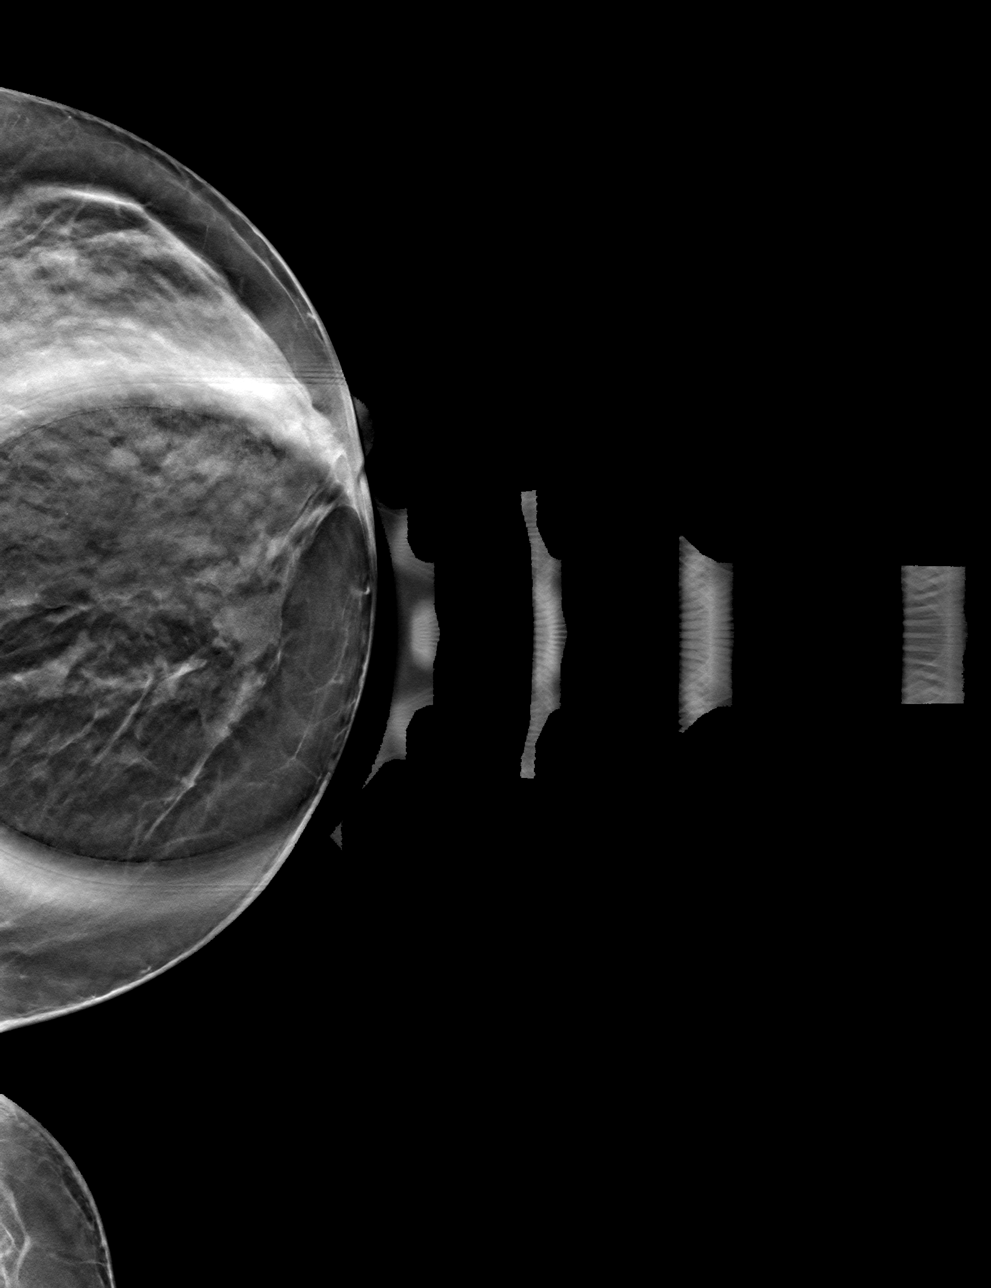

[4 of 12 positions shown; findings below may reference images not displayed]

ACR Breast Density Category c: The breast tissue is heterogeneously
dense, which may obscure small masses.
FINDINGS: The previously described finding does not persist with additional
views, consistent with superimposed fibroglandular tissue.
Fibroglandular tissue assumes a configuration stable in comparison
to remote prior mammograms. No suspicious mass, microcalcification,
or other finding is identified.

On physical exam, no suspicious mass is appreciated.

Targeted ultrasound was performed of the LEFT inner breast. No
suspicious cystic or solid mass is seen at the site of screening
mammographic concern. Dense fibroglandular tissue is noted.
IMPRESSION: 1. No mammographic or sonographic evidence of malignancy at the site
of screening mammographic concern in the LEFT inner breast.

RECOMMENDATION:
Recommend evaluation for candidacy for annual high risk screening
protocol with annual mammogram and breast MRI with and without
contrast.

The American Cancer Society recommends annual MRI and mammography in
patients with an estimated lifetime risk of developing breast cancer
greater than 20 - 25%, or who are known or suspected to be positive
for the breast cancer gene.

I have discussed the findings and recommendations with the patient.
If applicable, a reminder letter will be sent to the patient
regarding the next appointment.

BI-RADS CATEGORY  1: Negative.

## 2022-08-24 IMAGING — US US BREAST*L* LIMITED INC AXILLA
1 series · 2 of 2 positions shown · non-contrast
Comparison: Previous exam(s).

CLINICAL DATA: Callback for LEFT breast asymmetry seen on CC view
only. Strong family history of breast cancer

EXAM:
DIGITAL DIAGNOSTIC UNILATERAL LEFT MAMMOGRAM WITH TOMOSYNTHESIS AND
CAD; ULTRASOUND LEFT BREAST LIMITED
TECHNIQUE: Left digital diagnostic mammography and breast tomosynthesis was
performed. The images were evaluated with computer-aided detection.;
Targeted ultrasound examination of the left breast was performed.

[Series 1: us breast*left* limited inc axilla · 0.06mm/px · 2 of 2 slices shown]
[im 1/2]
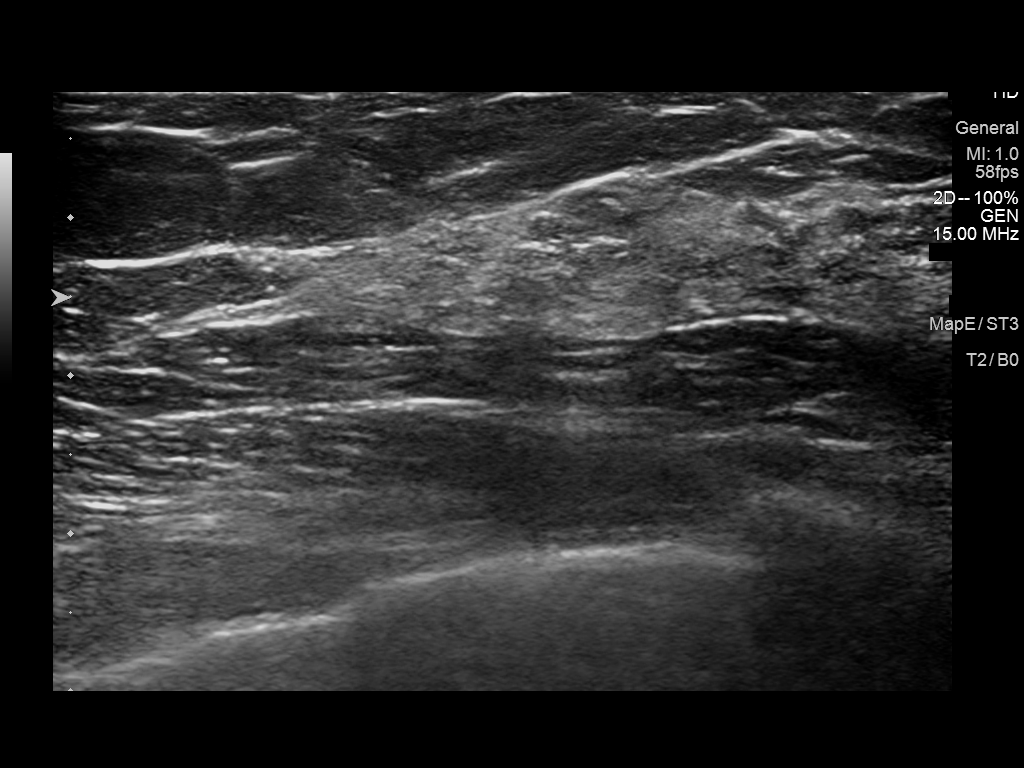
[im 2/2]
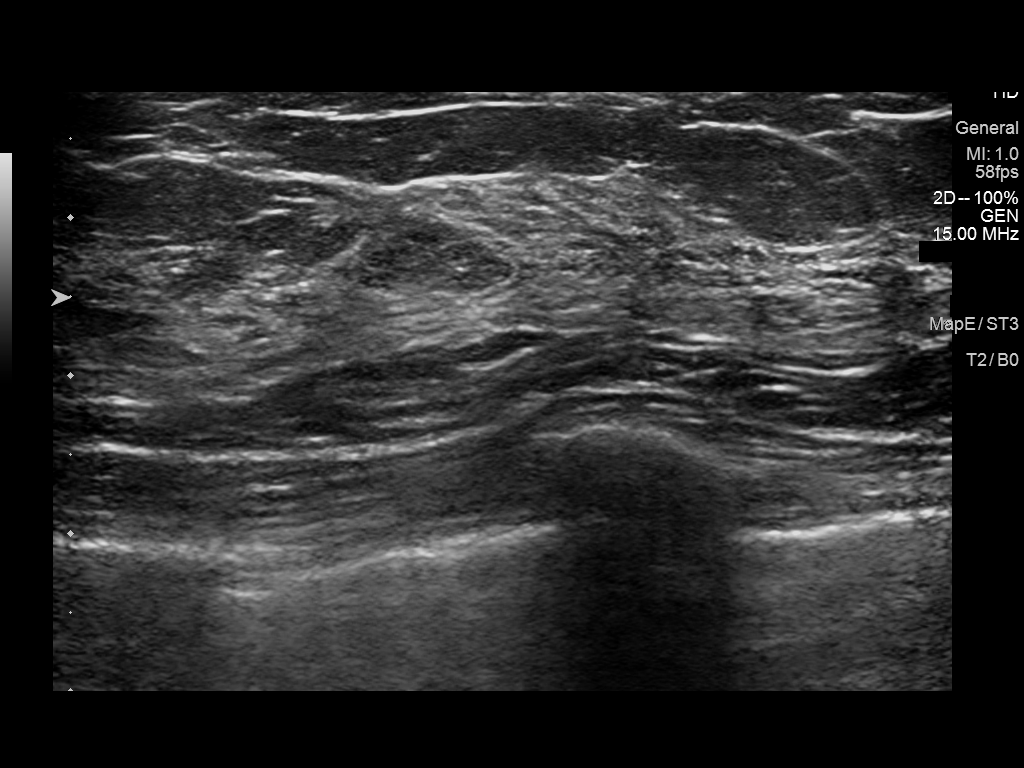

[2 of 2 positions shown; findings below may reference images not displayed]

ACR Breast Density Category c: The breast tissue is heterogeneously
dense, which may obscure small masses.
FINDINGS: The previously described finding does not persist with additional
views, consistent with superimposed fibroglandular tissue.
Fibroglandular tissue assumes a configuration stable in comparison
to remote prior mammograms. No suspicious mass, microcalcification,
or other finding is identified.

On physical exam, no suspicious mass is appreciated.

Targeted ultrasound was performed of the LEFT inner breast. No
suspicious cystic or solid mass is seen at the site of screening
mammographic concern. Dense fibroglandular tissue is noted.
IMPRESSION: 1. No mammographic or sonographic evidence of malignancy at the site
of screening mammographic concern in the LEFT inner breast.

RECOMMENDATION:
Recommend evaluation for candidacy for annual high risk screening
protocol with annual mammogram and breast MRI with and without
contrast.

The [employer] recommends annual MRI and mammography in
patients with an estimated lifetime risk of developing breast cancer
greater than 20 - 25%, or who are known or suspected to be positive
for the breast cancer gene.

I have discussed the findings and recommendations with the patient.
If applicable, a reminder letter will be sent to the patient
regarding the next appointment.

BI-RADS CATEGORY  1: Negative.

## 2023-03-15 ENCOUNTER — Ambulatory Visit: Payer: Self-pay | Admitting: Family

## 2023-05-11 ENCOUNTER — Encounter: Payer: Self-pay | Admitting: Family

## 2023-05-11 ENCOUNTER — Ambulatory Visit (INDEPENDENT_AMBULATORY_CARE_PROVIDER_SITE_OTHER): Payer: No Typology Code available for payment source | Admitting: Family

## 2023-05-11 ENCOUNTER — Telehealth: Payer: Self-pay | Admitting: Family

## 2023-05-11 VITALS — BP 108/60 | HR 68 | Temp 98.0°F | Ht 62.0 in | Wt 145.0 lb

## 2023-05-11 DIAGNOSIS — Z1231 Encounter for screening mammogram for malignant neoplasm of breast: Secondary | ICD-10-CM

## 2023-05-11 DIAGNOSIS — Z23 Encounter for immunization: Secondary | ICD-10-CM | POA: Diagnosis not present

## 2023-05-11 DIAGNOSIS — H469 Unspecified optic neuritis: Secondary | ICD-10-CM

## 2023-05-11 DIAGNOSIS — R7303 Prediabetes: Secondary | ICD-10-CM

## 2023-05-11 DIAGNOSIS — Z0000 Encounter for general adult medical examination without abnormal findings: Secondary | ICD-10-CM

## 2023-05-11 DIAGNOSIS — E7800 Pure hypercholesterolemia, unspecified: Secondary | ICD-10-CM | POA: Diagnosis not present

## 2023-05-11 DIAGNOSIS — E538 Deficiency of other specified B group vitamins: Secondary | ICD-10-CM | POA: Diagnosis not present

## 2023-05-11 DIAGNOSIS — E559 Vitamin D deficiency, unspecified: Secondary | ICD-10-CM | POA: Diagnosis not present

## 2023-05-11 DIAGNOSIS — D251 Intramural leiomyoma of uterus: Secondary | ICD-10-CM | POA: Insufficient documentation

## 2023-05-11 LAB — VITAMIN B12: Vitamin B-12: 416 pg/mL (ref 211–911)

## 2023-05-11 LAB — BASIC METABOLIC PANEL
BUN: 14 mg/dL (ref 6–23)
CO2: 30 meq/L (ref 19–32)
Calcium: 9.6 mg/dL (ref 8.4–10.5)
Chloride: 102 meq/L (ref 96–112)
Creatinine, Ser: 0.72 mg/dL (ref 0.40–1.20)
GFR: 91.77 mL/min (ref >60.00)
Glucose, Bld: 102 mg/dL — ABNORMAL HIGH (ref 70–99)
Potassium: 4.4 meq/L (ref 3.5–5.1)
Sodium: 141 meq/L (ref 135–145)

## 2023-05-11 LAB — HEMOGLOBIN A1C: Hgb A1c MFr Bld: 6 % (ref 4.6–6.5)

## 2023-05-11 LAB — LIPID PANEL
Cholesterol: 251 mg/dL — ABNORMAL HIGH (ref 0–200)
HDL: 74.1 mg/dL (ref >39.00)
LDL Cholesterol: 152 mg/dL — ABNORMAL HIGH (ref 0–99)
NonHDL: 177.12
Total CHOL/HDL Ratio: 3
Triglycerides: 126 mg/dL (ref 0.0–149.0)
VLDL: 25.2 mg/dL (ref 0.0–40.0)

## 2023-05-11 LAB — CBC
HCT: 44.5 % (ref 36.0–46.0)
Hemoglobin: 14.6 g/dL (ref 12.0–15.0)
MCHC: 32.8 g/dL (ref 30.0–36.0)
MCV: 94.6 fL (ref 78.0–100.0)
Platelets: 311 10*3/uL (ref 150.0–400.0)
RBC: 4.7 Mil/uL (ref 3.87–5.11)
RDW: 13.3 % (ref 11.5–15.5)
WBC: 5.4 10*3/uL (ref 4.0–10.5)

## 2023-05-11 NOTE — Addendum Note (Signed)
 Addended by: Rona Cobia on: 05/11/2023 11:31 AM   Modules accepted: Orders

## 2023-05-11 NOTE — Assessment & Plan Note (Signed)
 Possible left as well, pt following with ophthalmology. Has been referred to neurology as well, pending consult.

## 2023-05-11 NOTE — Assessment & Plan Note (Signed)
Pt advised of the following: Work on a diabetic diet, try to incorporate exercise at least 20-30 a day for 3 days a week or more.  A1c ordered today pending results.

## 2023-05-11 NOTE — Assessment & Plan Note (Signed)

## 2023-05-11 NOTE — Assessment & Plan Note (Signed)
 Ordered lipid panel, pending results. Work on low cholesterol diet and exercise as tolerated

## 2023-05-11 NOTE — Progress Notes (Signed)
 Subjective:  Patient ID: Erin Robertson, female    DOB: [birth year]  Age: 58 y.o. MRN: [id number]  Patient Care Team: Erin Horns, FNP as PCP - General (Family Medicine)   CC:  Chief Complaint  Patient presents with   Annual Exam    HPI Erin Robertson is a 58 y.o. female who presents today for an annual physical exam. She reports consuming a general diet.  Exercises regularly her husband and her walk throughout the week  She generally feels well. She reports sleeping fairly well. She does not have additional problems to discuss today.   Vision:Within last year Dental:Receives regular dental care  Mammogram: 08/10/22  Last pap: march 07/06/2018, negative  Colonoscopy:10/23/21 repeat every ten years.  Bone density scan:menopause already. < 49 y/o , last period 2017. Did have ablation   TDAP will get today.   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 States went through a series of tests back then and she states they concluded there may have been a blood clot in the eye that time. Has not had an episode similar until recently, this occurred in the opposite eye (left eye) that occurred end of September resulting in slight loss of vision. Has seen ophthalmology and has been referred to Dr. Mason Sole, she is to call to make an appointment.     Advanced Directives Patient does have advanced directives  DEPRESSION SCREENING    05/11/2023    8:42 AM 06/24/2021    8:03 AM 05/12/2021   11:14 AM 03/24/2012    1:58 PM  PHQ 2/9 Scores  PHQ - 2 Score 0 0 0 0  PHQ- 9 Score 0        ROS: Negative unless specifically indicated above in HPI.    Current Outpatient Medications:    aspirin 81 MG chewable tablet, Chew 81 mg by mouth daily., Disp: , Rfl:    b complex vitamins capsule, Take 1 capsule by mouth daily., Disp: , Rfl:    cholecalciferol (VITAMIN D3) 25 MCG (1000 UNIT) tablet, Take 2,000 Units by mouth daily., Disp: , Rfl:    magnesium gluconate (MAGONATE) 500 MG tablet,  Take 500 mg by mouth 2 (two) times a week., Disp: , Rfl:    Omega-3 Fatty Acids (FISH OIL) 300 MG CAPS, Take by mouth., Disp: , Rfl:     Objective:    BP 108/60   Pulse 68   Temp 98 F (36.7 C) (Temporal)   Ht 5\' 2"  (1.575 m)   Wt 145 lb (65.8 kg)   LMP 06/24/2016   SpO2 97%   BMI 26.52 kg/m   BP Readings from Last 3 Encounters:  05/11/23 108/60  10/23/21 114/70  09/17/21 (!) 88/53      Physical Exam Constitutional:      General: She is not in acute distress.    Appearance: Normal appearance. She is normal weight. She is not ill-appearing.  HENT:     Head: Normocephalic.     Right Ear: Tympanic membrane normal.     Left Ear: Tympanic membrane normal.     Nose: Nose normal.     Mouth/Throat:     Mouth: Mucous membranes are moist.  Eyes:     Extraocular Movements: Extraocular movements intact.     Pupils: Pupils are equal, round, and reactive to light.  Cardiovascular:     Rate and Rhythm: Normal rate and regular rhythm.  Pulmonary:     Effort: Pulmonary effort is normal.  Breath sounds: Normal breath sounds.  Abdominal:     General: Abdomen is flat. Bowel sounds are normal.     Palpations: Abdomen is soft.     Tenderness: There is no guarding or rebound.  Musculoskeletal:        General: Normal range of motion.     Cervical back: Normal range of motion.  Skin:    General: Skin is warm.     Capillary Refill: Capillary refill takes less than 2 seconds.  Neurological:     General: No focal deficit present.     Mental Status: She is alert.  Psychiatric:        Mood and Affect: Mood normal.        Behavior: Behavior normal.        Thought Content: Thought content normal.        Judgment: Judgment normal.          Assessment & Plan:  Vitamin D  deficiency  Screening mammogram for breast cancer  Hypercholesteremia Assessment & Plan: Ordered lipid panel, pending results. Work on low cholesterol diet and exercise as  tolerated    Pre-diabetes Assessment & Plan: Pt advised of the following: Work on a diabetic diet, try to incorporate exercise at least 20-30 a day for 3 days a week or more.  A1c ordered today pending results    Orders: -     Hemoglobin A1c  Encounter for general adult medical examination without abnormal findings Assessment & Plan: Patient Counseling(The following topics were reviewed):  Preventative care handout given to pt  Health maintenance and immunizations reviewed. Please refer to Health maintenance section. Pt advised on safe sex, wearing seatbelts in car, and proper nutrition labwork ordered today for annual Dental health: Discussed importance of regular tooth brushing, flossing, and dental visits.   Orders: -     3D Screening Mammogram, Left and Right; Future -     CBC -     Basic metabolic panel -     Lipid panel  Low serum vitamin B12 -     Vitamin B12  Optic neuritis, right Assessment & Plan: Possible left as well, pt following with ophthalmology. Has been referred to neurology as well, pending consult.     Last metabolic panel Lab Results  Component Value Date   GLUCOSE 142 (H) 06/24/2021   NA 142 06/24/2021   K 3.5 06/24/2021   CL 103 06/24/2021   CO2 29 06/24/2021   BUN 9 06/24/2021   CREATININE 0.84 06/24/2021   GFR 77.28 06/24/2021   CALCIUM 10.2 06/24/2021   PROT 7.3 06/24/2021   PROT 6.9 06/24/2021   ALBUMIN 5.0 06/24/2021   LABGLOB 2.7 06/24/2021   BILITOT 0.6 06/24/2021   ALKPHOS 66 06/24/2021   AST 14 06/24/2021   ALT 15 06/24/2021   ANIONGAP 9 01/06/2021      Follow-up: Return for schedule pap smear .   Erin Horns, FNP

## 2023-05-12 ENCOUNTER — Encounter: Payer: Self-pay | Admitting: Family

## 2023-05-12 ENCOUNTER — Other Ambulatory Visit: Payer: Self-pay | Admitting: Family

## 2023-05-12 DIAGNOSIS — E7800 Pure hypercholesterolemia, unspecified: Secondary | ICD-10-CM

## 2023-05-12 DIAGNOSIS — E559 Vitamin D deficiency, unspecified: Secondary | ICD-10-CM

## 2023-05-17 NOTE — Telephone Encounter (Signed)
erroneous

## 2023-08-11 ENCOUNTER — Ambulatory Visit
Admission: RE | Admit: 2023-08-11 | Discharge: 2023-08-11 | Disposition: A | Discharge status: Home / Self Care | Payer: No Typology Code available for payment source | Source: Ambulatory Visit | Attending: Family | Admitting: Family

## 2023-08-11 DIAGNOSIS — Z1231 Encounter for screening mammogram for malignant neoplasm of breast: Secondary | ICD-10-CM | POA: Insufficient documentation

## 2023-08-11 DIAGNOSIS — Z0000 Encounter for general adult medical examination without abnormal findings: Secondary | ICD-10-CM

## 2023-08-15 ENCOUNTER — Encounter: Payer: Self-pay | Admitting: Family

## 2023-08-22 ENCOUNTER — Other Ambulatory Visit (INDEPENDENT_AMBULATORY_CARE_PROVIDER_SITE_OTHER): Payer: No Typology Code available for payment source

## 2023-08-22 ENCOUNTER — Ambulatory Visit (INDEPENDENT_AMBULATORY_CARE_PROVIDER_SITE_OTHER): Payer: No Typology Code available for payment source | Admitting: Family

## 2023-08-22 ENCOUNTER — Other Ambulatory Visit (HOSPITAL_COMMUNITY)
Admission: RE | Admit: 2023-08-22 | Discharge: 2023-08-22 | Disposition: A | Discharge status: Home / Self Care | Source: Ambulatory Visit | Attending: Family | Admitting: Family

## 2023-08-22 ENCOUNTER — Encounter: Payer: Self-pay | Admitting: Family

## 2023-08-22 VITALS — BP 122/72 | HR 73 | Temp 97.7°F | Ht 62.0 in | Wt 141.6 lb

## 2023-08-22 DIAGNOSIS — R7303 Prediabetes: Secondary | ICD-10-CM

## 2023-08-22 DIAGNOSIS — Z124 Encounter for screening for malignant neoplasm of cervix: Secondary | ICD-10-CM | POA: Diagnosis present

## 2023-08-22 DIAGNOSIS — E7800 Pure hypercholesterolemia, unspecified: Secondary | ICD-10-CM

## 2023-08-22 DIAGNOSIS — E559 Vitamin D deficiency, unspecified: Secondary | ICD-10-CM | POA: Diagnosis not present

## 2023-08-22 LAB — LIPID PANEL
Cholesterol: 222 mg/dL — ABNORMAL HIGH (ref 0–200)
HDL: 77.5 mg/dL (ref >39.00)
LDL Cholesterol: 122 mg/dL — ABNORMAL HIGH (ref 0–99)
NonHDL: 144.15
Total CHOL/HDL Ratio: 3
Triglycerides: 112 mg/dL (ref 0.0–149.0)
VLDL: 22.4 mg/dL (ref 0.0–40.0)

## 2023-08-22 LAB — VITAMIN D 25 HYDROXY (VIT D DEFICIENCY, FRACTURES): VITD: 34.53 ng/mL (ref 30.00–100.00)
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
## 2023-08-22 NOTE — Assessment & Plan Note (Signed)
Pt verbalized consent for pelvic exam Declines std testing hpv thin prep ordered and pending results Pap exam in office completed, pt tolerated well.   

## 2023-08-23 NOTE — Telephone Encounter (Signed)
 Can we please add A1c on to yesterdays labs? Pt request

## 2023-08-25 ENCOUNTER — Encounter: Payer: Self-pay | Admitting: Family

## 2023-08-25 LAB — CYTOLOGY - PAP
Comment: NEGATIVE
Diagnosis: NEGATIVE
High risk HPV: NEGATIVE

## 2024-08-16 ENCOUNTER — Encounter: Admitting: Family
